# Patient Record
Sex: Female | Born: 1970 | Race: White | Hispanic: Yes | Marital: Married | Smoking: Never smoker
Health system: Southern US, Community
[De-identification: ages and names within clinical notes are randomized; demographics above are authoritative.]

## PROBLEM LIST (undated history)

## (undated) DIAGNOSIS — E079 Disorder of thyroid, unspecified: Secondary | ICD-10-CM

## (undated) DIAGNOSIS — T4145XA Adverse effect of unspecified anesthetic, initial encounter: Secondary | ICD-10-CM

## (undated) DIAGNOSIS — T8859XA Other complications of anesthesia, initial encounter: Secondary | ICD-10-CM

## (undated) HISTORY — DX: Disorder of thyroid, unspecified: E07.9

---

## 2005-01-01 ENCOUNTER — Emergency Department (HOSPITAL_COMMUNITY): Admission: EM | Admit: 2005-01-01 | Discharge: 2005-01-02 | Payer: Self-pay | Admitting: *Deleted

## 2005-11-19 ENCOUNTER — Encounter (INDEPENDENT_AMBULATORY_CARE_PROVIDER_SITE_OTHER): Payer: Self-pay | Admitting: *Deleted

## 2005-11-19 LAB — CONVERTED CEMR LAB

## 2005-12-01 ENCOUNTER — Ambulatory Visit: Payer: Self-pay | Admitting: Family Medicine

## 2005-12-08 ENCOUNTER — Ambulatory Visit: Payer: Self-pay | Admitting: Family Medicine

## 2006-01-08 ENCOUNTER — Ambulatory Visit: Payer: Self-pay | Admitting: Family Medicine

## 2006-01-20 ENCOUNTER — Ambulatory Visit (HOSPITAL_COMMUNITY): Admission: RE | Admit: 2006-01-20 | Discharge: 2006-01-20 | Payer: Self-pay | Admitting: Obstetrics & Gynecology

## 2006-02-09 ENCOUNTER — Ambulatory Visit: Payer: Self-pay | Admitting: Family Medicine

## 2006-03-10 ENCOUNTER — Ambulatory Visit: Payer: Self-pay | Admitting: Family Medicine

## 2006-03-15 ENCOUNTER — Encounter (INDEPENDENT_AMBULATORY_CARE_PROVIDER_SITE_OTHER): Payer: Self-pay | Admitting: Family Medicine

## 2006-03-15 ENCOUNTER — Ambulatory Visit: Payer: Self-pay | Admitting: Family Medicine

## 2006-03-19 ENCOUNTER — Encounter (INDEPENDENT_AMBULATORY_CARE_PROVIDER_SITE_OTHER): Payer: Self-pay | Admitting: *Deleted

## 2006-04-12 ENCOUNTER — Ambulatory Visit: Payer: Self-pay | Admitting: Sports Medicine

## 2006-04-13 ENCOUNTER — Ambulatory Visit: Payer: Self-pay | Admitting: Family Medicine

## 2006-04-13 ENCOUNTER — Encounter (INDEPENDENT_AMBULATORY_CARE_PROVIDER_SITE_OTHER): Payer: Self-pay | Admitting: Family Medicine

## 2006-05-17 ENCOUNTER — Ambulatory Visit: Payer: Self-pay | Admitting: Family Medicine

## 2006-05-28 ENCOUNTER — Ambulatory Visit: Payer: Self-pay | Admitting: Family Medicine

## 2006-05-30 ENCOUNTER — Inpatient Hospital Stay (HOSPITAL_COMMUNITY): Admission: AD | Admit: 2006-05-30 | Discharge: 2006-05-30 | Payer: Self-pay | Admitting: Family Medicine

## 2006-05-30 ENCOUNTER — Ambulatory Visit: Payer: Self-pay | Admitting: Obstetrics and Gynecology

## 2006-06-02 ENCOUNTER — Encounter (INDEPENDENT_AMBULATORY_CARE_PROVIDER_SITE_OTHER): Payer: Self-pay | Admitting: Family Medicine

## 2006-06-02 ENCOUNTER — Ambulatory Visit: Payer: Self-pay | Admitting: Family Medicine

## 2006-06-09 ENCOUNTER — Ambulatory Visit: Payer: Self-pay | Admitting: Family Medicine

## 2006-06-09 ENCOUNTER — Encounter: Payer: Self-pay | Admitting: Family Medicine

## 2006-06-09 LAB — CONVERTED CEMR LAB
Blood in Urine, dipstick: NEGATIVE
Ketones, urine, test strip: NEGATIVE
Nitrite: NEGATIVE
Urobilinogen, UA: 0.2
pH: 7

## 2006-06-16 ENCOUNTER — Ambulatory Visit: Payer: Self-pay | Admitting: Family Medicine

## 2006-06-21 ENCOUNTER — Ambulatory Visit: Payer: Self-pay | Admitting: Obstetrics & Gynecology

## 2006-06-21 ENCOUNTER — Inpatient Hospital Stay (HOSPITAL_COMMUNITY): Admission: AD | Admit: 2006-06-21 | Discharge: 2006-06-24 | Payer: Self-pay | Admitting: Obstetrics & Gynecology

## 2006-06-22 ENCOUNTER — Ambulatory Visit: Payer: Self-pay | Admitting: Family Medicine

## 2006-08-05 ENCOUNTER — Ambulatory Visit: Payer: Self-pay | Admitting: Family Medicine

## 2007-09-22 ENCOUNTER — Emergency Department (HOSPITAL_COMMUNITY): Admission: EM | Admit: 2007-09-22 | Discharge: 2007-09-23 | Payer: Self-pay | Admitting: Emergency Medicine

## 2009-08-04 ENCOUNTER — Emergency Department (HOSPITAL_COMMUNITY): Admission: EM | Admit: 2009-08-04 | Discharge: 2009-08-04 | Payer: Self-pay | Admitting: Emergency Medicine

## 2010-02-09 ENCOUNTER — Encounter: Payer: Self-pay | Admitting: Sports Medicine

## 2010-04-05 LAB — CBC
HCT: 40.9 % (ref 36.0–46.0)
Hemoglobin: 14.2 g/dL (ref 12.0–15.0)
MCHC: 34.8 g/dL (ref 30.0–36.0)
RBC: 4.48 MIL/uL (ref 3.87–5.11)
WBC: 6.4 10*3/uL (ref 4.0–10.5)

## 2010-04-05 LAB — BASIC METABOLIC PANEL
Calcium: 8.8 mg/dL (ref 8.4–10.5)
GFR calc Af Amer: >60 mL/min (ref >60)
GFR calc non Af Amer: >60 mL/min (ref >60)
Glucose, Bld: 85 mg/dL (ref 70–99)
Potassium: 3.3 mEq/L — ABNORMAL LOW (ref 3.5–5.1)
Sodium: 134 mEq/L — ABNORMAL LOW (ref 135–145)

## 2010-04-05 LAB — CK TOTAL AND CKMB (NOT AT ARMC)
CK, MB: 2.4 ng/mL (ref 0.3–4.0)
Relative Index: INVALID (ref 0.0–2.5)
Total CK: 98 U/L (ref 7–177)

## 2010-04-05 LAB — D-DIMER, QUANTITATIVE: D-Dimer, Quant: 0.26 ug/mL-FEU (ref 0.00–0.48)

## 2010-04-05 LAB — TROPONIN I: Troponin I: <0.01 ng/mL (ref 0.00–0.06)

## 2010-06-03 NOTE — Discharge Summary (Signed)
NAME:  Julia Espinoza, Julia Espinoza             ACCOUNT NO.:  [id number]   MEDICAL RECORD NO.:  [id number]          PATIENT TYPE:  INP   LOCATION:  9127                          FACILITY:  [facility]   PHYSICIAN:  Allie Bossier, MD        DATE OF BIRTH:  Dec 17, 1970   DATE OF ADMISSION:  06/21/2006  DATE OF DISCHARGE:                               DISCHARGE SUMMARY   DISCHARGE DIAGNOSES:  1. Status post primary low transverse Cesarean section for failure to      progress with delivery of viable female infant.  2. Postoperative anemia.  3. Status post bilateral tubal ligation.   DISCHARGE MEDICATIONS:  1. Percocet 5/325 one tablet every 4 hours as needed for pain.  2. Motrin 600 mg 1 tablet every 6 hours as needed for pain.  3. Colace 100 mg by mouth twice daily as needed for constipation.  4. Iron sulfate 325 mg by mouth once daily.   PERTINENT LABORATORY RESULTS:  Blood pressure type A positive. Rubella  immune. GBS negative. Hepatitis B status is negative. HIV is  nonreactive. Discharge hemoglobin is 8 with hematocrit of 24.7.   HOSPITAL COURSE:  This is a 40 year old, gravida 22, para 8-0-1-8 who  presented to [facility] with an intrauterine pregnancy at 39-4/7  weeks. She was in active labor. She was taken for primary cesarean  section due to failure to progress. Please see operative note for full  details of this procedure. The patient had a routine postpartum course  with the exception of some mild anemia. Her hemoglobin was 12.9 on  admission and subsequently 9.1, then 7.9, then 8. She did receive a 500  mL normal saline bolus on June 4 for some dizziness which has since  resolved at the time of discharge. She was ambulating without any  dizziness. She is breast and bottle feeding and does not desire  circumcision for her female infant. Her pain is well controlled with pain  medicines, and she is agreeable to early discharge on postoperative day  #2. She will follow up at the [facility] Department in 6  weeks for her postpartum check.     ______________________________  Sylvan Cheese, M.D.      Allie Bossier, MD  Electronically Signed    MJ/MEDQ  D:  06/24/2006  T:  06/24/2006  Job:  [id number]

## 2010-06-03 NOTE — Op Note (Signed)
NAME:  Julia Espinoza, Julia Espinoza             ACCOUNT NO.:  [id number]   MEDICAL RECORD NO.:  [id number]          PATIENT TYPE:  INP   LOCATION:  9127                          FACILITY:  [facility]   PHYSICIAN:  Allie Bossier, MD        DATE OF BIRTH:  [birth year]   DATE OF PROCEDURE:  06/22/2006  DATE OF DISCHARGE:                               OPERATIVE REPORT   PREOPERATIVE DIAGNOSES:  1. Cephalopelvic disproportion.  2. Grand multiparity, desires sterility.   POSTOPERATIVE DIAGNOSES:  1. Cephalopelvic disproportion.  2. Grand multiparity, desires sterility.   PROCEDURE:  Primary low transverse Cesarean section and postpartum  sterilization procedure with Filshie clips.   SURGEON:  Myra C. Marice Potter, M.D.   ANESTHESIA:  Epidural, Ninetta Lights, M.D.   COMPLICATIONS:  None.   ESTIMATED BLOOD LOSS:  800 mL.   SPECIMENS:  Cord blood.   FINDINGS:  1. Living female infant.  2. Normal pelvic anatomy.  3. Intact placenta with three-vessel cord.   DETAILS OF PROCEDURE AND FINDINGS:  Risks, benefits and alternatives of  surgery were explained, understood and accepted. She understands a 1%  failure rate of tubal ligation and wishes to proceed. Her epidural was  bolused for surgery in the operating room. She was placed in a dorsal  supine position with a left lateral tilt. Her abdomen was prepped and  draped in usual sterile fashion. Her legs were placed in the frog-leg  position to facilitate assistance elevating the head into the pelvis.  After adequate anesthesia was assured, a transverse incision was made  approximately 2 cm above the symphysis pubis. Incision was carried down  through the subcutaneous tissue to the fascia. Bleeding encountered was  cauterized with the Bovie. Fascia was scored in the midline. Fascial  incision was extended bilaterally. The rectus muscles were partially  separated in a transverse fashion using electrosurgical technique.  Excellent hemostasis was noted. Upon entering into  the pelvis, the  bladder was noted to be frankly distended. The Foley bulb was palpable  deep in the pelvis. Taking care to avoid the bladder, the peritoneum was  entered, and the peritoneal incision was extended bilaterally with the  Bovie. A bladder blade was placed, and a transverse incision was made on  the well-developed lower uterine segment. Amniotomy was performed with  hemostats. The baby was delivered from an occiput transverse position.  An assistant was able to push the head up from the vagina into the  pelvis with a marked amount of caput noted. The baby's mouth and  nostrils were suctioned after delivering the head. The remainder of the  baby was then delivered. His cord was clamped and cut, and he was  transferred to the pediatrician for routine care. Cord blood sample was  obtained. The placenta was extracted using traction. The uterus was left  in situ. The interior of the uterus was cleaned with a dry lap sponge, a  bladder blade was placed and the uterine incision was closed with one  layer of 0 chromic running locking suture. Excellent hemostasis was  noted. By tilting the uterus, I was able  to visualize each oviduct and  then the isthmic region of each oviduct. I placed a Filshie clip across  the entire oviduct. The uterine incision was again inspected and noted  to be hemostatic as were the rectus muscles and rectus fascia. Please  note that initially after the delivery of the baby's head the urine was  pink tinged. However, by the end of the case, clear urine was in the  Foley tubing. The fascia was then closed with a 0 Vicryl running  nonlocking suture. No defects were palpable. The subcutaneous tissue was  irrigated, cleaned and dried. It was then infiltrated with 20 mL of 0.5%  Marcaine. A subcuticular closure was done with a 3-0 Vicryl suture.  Steri-Strips were placed throughout the incision. She was taken to the  recovery room in stable condition with the  instrument, sponge and needle  counts correct.      Allie Bossier, MD  Electronically Signed     MCD/MEDQ  D:  06/22/2006  T:  06/22/2006  Job:  [id number]

## 2010-10-22 LAB — URINALYSIS, ROUTINE W REFLEX MICROSCOPIC
Bilirubin Urine: NEGATIVE
Glucose, UA: NEGATIVE
Ketones, ur: NEGATIVE
Protein, ur: NEGATIVE

## 2010-10-22 LAB — URINE MICROSCOPIC-ADD ON

## 2010-10-22 LAB — DIFFERENTIAL
Basophils Absolute: 0
Eosinophils Relative: 2
Lymphocytes Relative: 29
Lymphs Abs: 2.2
Monocytes Absolute: 0.5

## 2010-10-22 LAB — BASIC METABOLIC PANEL
Chloride: 104
GFR calc non Af Amer: >60
Glucose, Bld: 90
Potassium: 3.9
Sodium: 134 — ABNORMAL LOW

## 2010-10-22 LAB — WET PREP, GENITAL

## 2010-10-22 LAB — CBC
HCT: 39.8
Hemoglobin: 13
MCV: 81.6
RDW: 15.9 — ABNORMAL HIGH

## 2010-10-22 LAB — GC/CHLAMYDIA PROBE AMP, GENITAL: GC Probe Amp, Genital: NEGATIVE

## 2010-11-06 LAB — CBC
HCT: 23.6 — ABNORMAL LOW
Hemoglobin: 7.9 — CL
RBC: 3.34 — ABNORMAL LOW
RBC: 4.29
WBC: 12.9 — ABNORMAL HIGH
WBC: 9.1

## 2010-11-06 LAB — RPR: RPR Ser Ql: NONREACTIVE

## 2012-06-17 ENCOUNTER — Emergency Department (HOSPITAL_COMMUNITY): Payer: Medicaid Other

## 2012-06-17 ENCOUNTER — Encounter (HOSPITAL_COMMUNITY): Payer: Self-pay | Admitting: *Deleted

## 2012-06-17 ENCOUNTER — Encounter (HOSPITAL_COMMUNITY): Admission: EM | Disposition: A | Discharge status: Home / Self Care | Payer: Self-pay | Source: Home / Self Care

## 2012-06-17 ENCOUNTER — Observation Stay (HOSPITAL_COMMUNITY): Payer: Medicaid Other

## 2012-06-17 ENCOUNTER — Observation Stay (HOSPITAL_COMMUNITY)
Admission: EM | Admit: 2012-06-17 | Discharge: 2012-06-18 | Disposition: A | Discharge status: Home / Self Care | Payer: Medicaid Other | Attending: General Surgery | Admitting: General Surgery

## 2012-06-17 ENCOUNTER — Encounter (HOSPITAL_COMMUNITY): Payer: Self-pay | Admitting: Anesthesiology

## 2012-06-17 ENCOUNTER — Observation Stay (HOSPITAL_COMMUNITY): Payer: Medicaid Other | Admitting: Anesthesiology

## 2012-06-17 DIAGNOSIS — K819 Cholecystitis, unspecified: Secondary | ICD-10-CM

## 2012-06-17 DIAGNOSIS — R1013 Epigastric pain: Secondary | ICD-10-CM | POA: Insufficient documentation

## 2012-06-17 DIAGNOSIS — R7401 Elevation of levels of liver transaminase levels: Secondary | ICD-10-CM

## 2012-06-17 DIAGNOSIS — K8020 Calculus of gallbladder without cholecystitis without obstruction: Secondary | ICD-10-CM

## 2012-06-17 DIAGNOSIS — R112 Nausea with vomiting, unspecified: Secondary | ICD-10-CM

## 2012-06-17 DIAGNOSIS — K8000 Calculus of gallbladder with acute cholecystitis without obstruction: Principal | ICD-10-CM | POA: Insufficient documentation

## 2012-06-17 DIAGNOSIS — K8010 Calculus of gallbladder with chronic cholecystitis without obstruction: Secondary | ICD-10-CM | POA: Insufficient documentation

## 2012-06-17 DIAGNOSIS — R109 Unspecified abdominal pain: Secondary | ICD-10-CM

## 2012-06-17 DIAGNOSIS — R1011 Right upper quadrant pain: Secondary | ICD-10-CM | POA: Insufficient documentation

## 2012-06-17 HISTORY — PX: CHOLECYSTECTOMY: SHX55

## 2012-06-17 LAB — POCT I-STAT TROPONIN I: Troponin i, poc: 0 ng/mL (ref 0.00–0.08)

## 2012-06-17 LAB — CBC WITH DIFFERENTIAL/PLATELET
Eosinophils Relative: 2 % (ref 0–5)
HCT: 41.5 % (ref 36.0–46.0)
Lymphocytes Relative: 23 % (ref 12–46)
Lymphs Abs: 2.7 10*3/uL (ref 0.7–4.0)
MCV: 88.7 fL (ref 78.0–100.0)
Monocytes Absolute: 0.6 10*3/uL (ref 0.1–1.0)
Platelets: 259 10*3/uL (ref 150–400)
RBC: 4.68 MIL/uL (ref 3.87–5.11)
WBC: 11.6 10*3/uL — ABNORMAL HIGH (ref 4.0–10.5)

## 2012-06-17 LAB — COMPREHENSIVE METABOLIC PANEL
ALT: 146 U/L — ABNORMAL HIGH (ref 0–35)
CO2: 25 mEq/L (ref 19–32)
Calcium: 9.3 mg/dL (ref 8.4–10.5)
GFR calc Af Amer: >90 mL/min (ref >90)
GFR calc non Af Amer: >90 mL/min (ref >90)
Glucose, Bld: 149 mg/dL — ABNORMAL HIGH (ref 70–99)
Sodium: 134 mEq/L — ABNORMAL LOW (ref 135–145)
Total Bilirubin: 0.3 mg/dL (ref 0.3–1.2)

## 2012-06-17 LAB — URINALYSIS, ROUTINE W REFLEX MICROSCOPIC
Bilirubin Urine: NEGATIVE
Hgb urine dipstick: NEGATIVE
Ketones, ur: 15 mg/dL — AB
Protein, ur: NEGATIVE mg/dL
Urobilinogen, UA: 0.2 mg/dL (ref 0.0–1.0)

## 2012-06-17 LAB — SURGICAL PCR SCREEN: MRSA, PCR: NEGATIVE

## 2012-06-17 SURGERY — LAPAROSCOPIC CHOLECYSTECTOMY WITH INTRAOPERATIVE CHOLANGIOGRAM
Anesthesia: General | Site: Abdomen | Wound class: Clean Contaminated

## 2012-06-17 MED ORDER — MORPHINE SULFATE 4 MG/ML IJ SOLN
4.0000 mg | Freq: Once | INTRAMUSCULAR | Status: AC
  Administered 2012-06-17: 4 mg via INTRAVENOUS
  Filled 2012-06-17: qty 1

## 2012-06-17 MED ORDER — HYDROMORPHONE HCL PF 1 MG/ML IJ SOLN
INTRAMUSCULAR | Status: AC
  Filled 2012-06-17: qty 1

## 2012-06-17 MED ORDER — MIDAZOLAM HCL 5 MG/5ML IJ SOLN
INTRAMUSCULAR | Status: DC | PRN
  Administered 2012-06-17 (×2): 1 mg via INTRAVENOUS

## 2012-06-17 MED ORDER — LIDOCAINE HCL (CARDIAC) 20 MG/ML IV SOLN
INTRAVENOUS | Status: DC | PRN
  Administered 2012-06-17: 100 mg via INTRAVENOUS

## 2012-06-17 MED ORDER — BUPIVACAINE HCL 0.25 % IJ SOLN
INTRAMUSCULAR | Status: DC | PRN
  Administered 2012-06-17: 7.5 mL

## 2012-06-17 MED ORDER — LIDOCAINE HCL 4 % MT SOLN
OROMUCOSAL | Status: DC | PRN
  Administered 2012-06-17: 2 mL via TOPICAL

## 2012-06-17 MED ORDER — HYDROMORPHONE HCL PF 1 MG/ML IJ SOLN: 1.0000 mg | INTRAMUSCULAR | Status: DC | PRN

## 2012-06-17 MED ORDER — IOHEXOL 300 MG/ML  SOLN
INTRAMUSCULAR | Status: DC | PRN
  Administered 2012-06-17: 5 mL via INTRAVENOUS

## 2012-06-17 MED ORDER — OXYCODONE HCL 5 MG PO TABS: 5.0000 mg | ORAL_TABLET | Freq: Once | ORAL | Status: DC | PRN

## 2012-06-17 MED ORDER — DIPHENHYDRAMINE HCL 50 MG/ML IJ SOLN: 12.5000 mg | Freq: Four times a day (QID) | INTRAMUSCULAR | Status: DC | PRN

## 2012-06-17 MED ORDER — MORPHINE SULFATE 2 MG/ML IJ SOLN
INTRAMUSCULAR | Status: AC
  Filled 2012-06-17: qty 1

## 2012-06-17 MED ORDER — FENTANYL CITRATE 0.05 MG/ML IJ SOLN
INTRAMUSCULAR | Status: DC | PRN
  Administered 2012-06-17 (×2): 50 ug via INTRAVENOUS
  Administered 2012-06-17: 125 ug via INTRAVENOUS
  Administered 2012-06-17: 100 ug via INTRAVENOUS
  Administered 2012-06-17: 25 ug via INTRAVENOUS

## 2012-06-17 MED ORDER — OXYCODONE-ACETAMINOPHEN 5-325 MG PO TABS
1.0000 | ORAL_TABLET | ORAL | Status: DC | PRN
  Administered 2012-06-17: 2 via ORAL
  Administered 2012-06-17 – 2012-06-18 (×2): 1 via ORAL
  Filled 2012-06-17 (×2): qty 1
  Filled 2012-06-17: qty 2

## 2012-06-17 MED ORDER — ONDANSETRON HCL 4 MG/2ML IJ SOLN
INTRAMUSCULAR | Status: DC | PRN
  Administered 2012-06-17: 4 mg via INTRAVENOUS

## 2012-06-17 MED ORDER — KCL IN DEXTROSE-NACL 20-5-0.45 MEQ/L-%-% IV SOLN
INTRAVENOUS | Status: DC
  Administered 2012-06-17: 18:00:00 via INTRAVENOUS
  Filled 2012-06-17 (×3): qty 1000

## 2012-06-17 MED ORDER — PROPOFOL 10 MG/ML IV BOLUS
INTRAVENOUS | Status: DC | PRN
  Administered 2012-06-17: 200 mg via INTRAVENOUS

## 2012-06-17 MED ORDER — HYDROMORPHONE HCL PF 1 MG/ML IJ SOLN
0.2500 mg | INTRAMUSCULAR | Status: DC | PRN
  Administered 2012-06-17: 0.5 mg via INTRAVENOUS

## 2012-06-17 MED ORDER — ROCURONIUM BROMIDE 100 MG/10ML IV SOLN
INTRAVENOUS | Status: DC | PRN
  Administered 2012-06-17: 40 mg via INTRAVENOUS

## 2012-06-17 MED ORDER — CEFAZOLIN SODIUM-DEXTROSE 2-3 GM-% IV SOLR
INTRAVENOUS | Status: DC | PRN
  Administered 2012-06-17: 2 g via INTRAVENOUS

## 2012-06-17 MED ORDER — PANTOPRAZOLE SODIUM 40 MG IV SOLR
40.0000 mg | Freq: Every day | INTRAVENOUS | Status: DC
  Administered 2012-06-17: 40 mg via INTRAVENOUS
  Filled 2012-06-17 (×2): qty 40

## 2012-06-17 MED ORDER — NEOSTIGMINE METHYLSULFATE 1 MG/ML IJ SOLN
INTRAMUSCULAR | Status: DC | PRN
  Administered 2012-06-17: 3 mg via INTRAVENOUS

## 2012-06-17 MED ORDER — LACTATED RINGERS IV SOLN
INTRAVENOUS | Status: DC | PRN
  Administered 2012-06-17: 12:00:00 via INTRAVENOUS

## 2012-06-17 MED ORDER — CEFAZOLIN SODIUM 1-5 GM-% IV SOLN
INTRAVENOUS | Status: AC
  Filled 2012-06-17: qty 100

## 2012-06-17 MED ORDER — GLYCOPYRROLATE 0.2 MG/ML IJ SOLN
INTRAMUSCULAR | Status: DC | PRN
  Administered 2012-06-17: 0.4 mg via INTRAVENOUS

## 2012-06-17 MED ORDER — CIPROFLOXACIN IN D5W 400 MG/200ML IV SOLN
400.0000 mg | Freq: Two times a day (BID) | INTRAVENOUS | Status: DC
  Filled 2012-06-17 (×2): qty 200

## 2012-06-17 MED ORDER — SODIUM CHLORIDE 0.9 % IR SOLN
Status: DC | PRN
  Administered 2012-06-17 (×2): 1000 mL

## 2012-06-17 MED ORDER — ONDANSETRON HCL 4 MG/2ML IJ SOLN
4.0000 mg | Freq: Four times a day (QID) | INTRAMUSCULAR | Status: DC | PRN
  Administered 2012-06-17: 4 mg via INTRAVENOUS
  Filled 2012-06-17: qty 2

## 2012-06-17 MED ORDER — FAMOTIDINE IN NACL 20-0.9 MG/50ML-% IV SOLN
20.0000 mg | Freq: Once | INTRAVENOUS | Status: AC
  Administered 2012-06-17: 20 mg via INTRAVENOUS
  Filled 2012-06-17: qty 50

## 2012-06-17 MED ORDER — SODIUM CHLORIDE 0.9 % IV BOLUS (SEPSIS)
1000.0000 mL | Freq: Once | INTRAVENOUS | Status: AC
  Administered 2012-06-17: 1000 mL via INTRAVENOUS

## 2012-06-17 MED ORDER — ONDANSETRON HCL 4 MG PO TABS: 4.0000 mg | ORAL_TABLET | Freq: Four times a day (QID) | ORAL | Status: DC | PRN

## 2012-06-17 MED ORDER — OXYCODONE HCL 5 MG/5ML PO SOLN: 5.0000 mg | Freq: Once | ORAL | Status: DC | PRN

## 2012-06-17 MED ORDER — KCL IN DEXTROSE-NACL 20-5-0.45 MEQ/L-%-% IV SOLN
INTRAVENOUS | Status: DC
  Filled 2012-06-17 (×3): qty 1000

## 2012-06-17 MED ORDER — MORPHINE SULFATE 2 MG/ML IJ SOLN
2.0000 mg | INTRAMUSCULAR | Status: DC | PRN
  Administered 2012-06-17: 2 mg via INTRAVENOUS
  Administered 2012-06-17: 4 mg via INTRAVENOUS
  Filled 2012-06-17: qty 2

## 2012-06-17 MED ORDER — DIPHENHYDRAMINE HCL 12.5 MG/5ML PO ELIX: 12.5000 mg | ORAL_SOLUTION | Freq: Four times a day (QID) | ORAL | Status: DC | PRN

## 2012-06-17 MED ORDER — PROMETHAZINE HCL 25 MG/ML IJ SOLN: 6.2500 mg | INTRAMUSCULAR | Status: DC | PRN

## 2012-06-17 MED ORDER — ONDANSETRON 4 MG PO TBDP
8.0000 mg | ORAL_TABLET | Freq: Once | ORAL | Status: AC
  Administered 2012-06-17: 8 mg via ORAL
  Filled 2012-06-17: qty 2

## 2012-06-17 MED ORDER — LACTATED RINGERS IV SOLN: INTRAVENOUS | Status: DC

## 2012-06-17 MED ORDER — ONDANSETRON HCL 4 MG/2ML IJ SOLN: 4.0000 mg | Freq: Four times a day (QID) | INTRAMUSCULAR | Status: DC | PRN

## 2012-06-17 SURGICAL SUPPLY — 39 items
APPLIER CLIP 5 13 M/L LIGAMAX5 (MISCELLANEOUS) ×2
BANDAGE ADHESIVE 1X3 (GAUZE/BANDAGES/DRESSINGS) ×2 IMPLANT
BENZOIN TINCTURE PRP APPL 2/3 (GAUZE/BANDAGES/DRESSINGS) ×2 IMPLANT
CANISTER SUCTION 2500CC (MISCELLANEOUS) ×2 IMPLANT
CHLORAPREP W/TINT 26ML (MISCELLANEOUS) ×2 IMPLANT
CLIP APPLIE 5 13 M/L LIGAMAX5 (MISCELLANEOUS) ×1 IMPLANT
CLOTH BEACON ORANGE TIMEOUT ST (SAFETY) ×2 IMPLANT
CLSR STERI-STRIP ANTIMIC 1/2X4 (GAUZE/BANDAGES/DRESSINGS) ×2 IMPLANT
COVER MAYO STAND STRL (DRAPES) ×2 IMPLANT
COVER SURGICAL LIGHT HANDLE (MISCELLANEOUS) ×2 IMPLANT
DEVICE TROCAR PUNCTURE CLOSURE (ENDOMECHANICALS) ×2 IMPLANT
DRAPE C-ARM 42X72 X-RAY (DRAPES) ×2 IMPLANT
DRAPE UTILITY 15X26 W/TAPE STR (DRAPE) ×4 IMPLANT
ELECT REM PT RETURN 9FT ADLT (ELECTROSURGICAL) ×2
ELECTRODE REM PT RTRN 9FT ADLT (ELECTROSURGICAL) ×1 IMPLANT
GAUZE SPONGE 2X2 8PLY STRL LF (GAUZE/BANDAGES/DRESSINGS) ×1 IMPLANT
GLOVE BIO SURGEON STRL SZ7.5 (GLOVE) ×4 IMPLANT
GOWN STRL NON-REIN LRG LVL3 (GOWN DISPOSABLE) ×6 IMPLANT
GOWN STRL REIN XL XLG (GOWN DISPOSABLE) ×4 IMPLANT
IV CATH 14GX2 1/4 (CATHETERS) ×2 IMPLANT
KIT BASIN OR (CUSTOM PROCEDURE TRAY) ×2 IMPLANT
KIT ROOM TURNOVER OR (KITS) ×2 IMPLANT
NEEDLE INSUFFLATION 14GA 120MM (NEEDLE) ×2 IMPLANT
NS IRRIG 1000ML POUR BTL (IV SOLUTION) ×2 IMPLANT
PAD ARMBOARD 7.5X6 YLW CONV (MISCELLANEOUS) ×4 IMPLANT
POUCH SPECIMEN RETRIEVAL 10MM (ENDOMECHANICALS) ×2 IMPLANT
SCISSORS LAP 5X35 DISP (ENDOMECHANICALS) ×2 IMPLANT
SET CHOLANGIOGRAPHY FRANKLIN (SET/KITS/TRAYS/PACK) ×2 IMPLANT
SET IRRIG TUBING LAPAROSCOPIC (IRRIGATION / IRRIGATOR) ×2 IMPLANT
SLEEVE ENDOPATH XCEL 5M (ENDOMECHANICALS) ×2 IMPLANT
SPECIMEN JAR SMALL (MISCELLANEOUS) ×2 IMPLANT
SPONGE GAUZE 2X2 STER 10/PKG (GAUZE/BANDAGES/DRESSINGS) ×1
SUT MNCRL AB 3-0 PS2 18 (SUTURE) ×2 IMPLANT
SUT MNCRL AB 4-0 PS2 18 (SUTURE) ×4 IMPLANT
TOWEL OR 17X24 6PK STRL BLUE (TOWEL DISPOSABLE) ×2 IMPLANT
TOWEL OR 17X26 10 PK STRL BLUE (TOWEL DISPOSABLE) ×2 IMPLANT
TRAY LAPAROSCOPIC (CUSTOM PROCEDURE TRAY) ×2 IMPLANT
TROCAR XCEL NON-BLD 11X100MML (ENDOMECHANICALS) ×2 IMPLANT
TROCAR XCEL NON-BLD 5MMX100MML (ENDOMECHANICALS) ×2 IMPLANT

## 2012-06-17 NOTE — Anesthesia Postprocedure Evaluation (Signed)
Anesthesia Post Note  Patient: Julia Espinoza  Procedure(s) Performed: Procedure(s) (LRB): LAPAROSCOPIC CHOLECYSTECTOMY WITH INTRAOPERATIVE CHOLANGIOGRAM (N/A)  Anesthesia type: general  Patient location: PACU  Post pain: Pain level controlled  Post assessment: Patient's Cardiovascular Status Stable  Last Vitals:  Filed Vitals:   06/17/12 1415  BP: 109/57  Pulse: 66  Temp:   Resp: 15    Post vital signs: Reviewed and stable  Level of consciousness: sedated  Complications: No apparent anesthesia complications

## 2012-06-17 NOTE — Op Note (Signed)
Pre Operative Diagnosis: acute cholecystitis  Post Operative Diagnosis: same  Surgeon: Dr. Axel Filler   Procedure: laparoscopic cholecystectomy with IOC  Assistant: none  Anesthesia: Gen. Endotracheal anesthesia   EBL: 10 cc  Complications:  Counts: reported as correct x 2   Findings: The patient had normal intraoperative cholangiogram. Chronic cholecystitis.  Indications for procedure: the patient is a 42 year old female with a history of several days of abdominal pain. Patient was diagnosed the ED with cholecystitis. Patient was counseled and consented to have her gallbladder removed.  Details of the procedure:  The patient was taken to the operating and placed in the supine position with bilateral SCDs in place. A time out was called and all facts were verified. A pneumoperitoneum was obtained via A Veress needle technique to a pressure of 14mm of mercury. A 5mm trochar was then placed in the right upper quadrant under visualization, and there were no injuries to any abdominal organs. A 11 mm port was then placed in the umbilical region after infiltrating with local anesthesia under direct visualization. A second and third epigastric port and right lower quadrant port placement under direct visualization, respectively. The gallbladder was identified and retracted, the peritoneum was then sharply dissected from the gallbladder and this dissection was carried down to Calot's triangle. The gallbladder was identified and stripped away circumferentially and seen going into the gallbladder 360. A Cook catheter was used to perform an intraoperative cholangiogram. The biliary radicals as well as the cystic duct and common bile duct were seen free of filling defects.  2 clips were placed proximally one distally and the cystic duct transected. The cystic artery was identified and 2 clips placed proximally and one distally and transected.  We then proceeded to remove the gallbladder off the  hepatic fossa with Bovie cautery. An Endo Catch bag was then placed in the abdomen and gallbladder placed in the bag. The hepatic fossa was then reexamined and hemostasis was achieved with Bovie cautery and was excellent at the end of the case. The subhepatic fossa and perihepatic fossa was then irrigated until the effluent was clear. The 11 mm trocar fascia was reapproximated with the Endo Close #1 Vicryl.  The pneumoperitoneum was evacuated and all trochars removed under direct visulalization.  The skin was then closed with 4-0 Monocryl and the skin dressed with Steri-Strips, gauze, and tape.  The patient was awaken from general anesthesia and taken to the recovery room in stable condition.

## 2012-06-17 NOTE — ED Notes (Signed)
Pt returned from US, placed back on monitor.

## 2012-06-17 NOTE — ED Provider Notes (Signed)
History     CSN: [id number]  Arrival date & time 06/17/12  0135   First MD Initiated Contact with Patient 06/17/12 0246      Chief Complaint  Patient presents with  . Abdominal Pain    (Consider location/radiation/quality/duration/timing/severity/associated sxs/prior treatment) HPI  Patient is a generally healthy 42 yo woman who is BIB family for evaluation and tx of abdominal pain, nausea and vomiting. Sx began three days ago but have been worsening. Pain is constant, aching, cramping, severe.    Patient denies history of similar sx. No fever. No diarrhea, bloody stools or GU sx.   History reviewed. No pertinent past medical history.  History reviewed. No pertinent past surgical history.  No family history on file.  History  Substance Use Topics  . Smoking status: Never Smoker   . Smokeless tobacco: Not on file  . Alcohol Use: No    OB History   Grav Para Term Preterm Abortions TAB SAB Ect Mult Living                  Review of Systems Gen: no weight loss, fevers, chills, night sweats Eyes: no discharge or drainage, no occular pain or visual changes Nose: no epistaxis or rhinorrhea Mouth: no dental pain, no sore throat Neck: no neck pain Lungs: no SOB, cough, wheezing CV: no chest pain, palpitations, dependent edema or orthopnea Abd: As per history of present illness, otherwise negative GU: no dysuria or gross hematuria MSK: no myalgias or arthralgias Neuro: no headache, no focal neurologic deficits Skin: no rash Psyche: negative.  Allergies  Review of patient's allergies indicates no known allergies.  Home Medications  No current outpatient prescriptions on file.  BP 130/72  Pulse 60  Temp(Src) 98.5 F (36.9 C) (Oral)  SpO2 99%  LMP 05/08/2012  Physical Exam Gen: well developed and well nourished appearing, appears to be in significant distress, writhing back and forth in bed and crying Head: NCAT Eyes: PERL, EOMI sclera are nonicteric Nose:  no epistaixis or rhinorrhea Mouth/throat: mucosa is moist and pink Neck: supple, no stridor Lungs: CTA B, no wheezing, rhonchi or rales CV: RRR, no murmur Abd: soft, nondistended there is significant tenderness over the epigastrium-most notably so over the midline epigastrium, no rebound or guarding. Back: no ttp, no cva ttp Skin: no rashese, wnl Neuro: CN ii-xii grossly intact, no focal deficits Psyche; normal affect,  anxious and uncomfortable appearing, and cooperative.  ED Course  Procedures (including critical care time)  Results for orders placed during the hospital encounter of 06/17/12 (from the past 24 hour(s))  CBC WITH DIFFERENTIAL     Status: Abnormal   Collection Time    06/17/12  2:30 AM      Result Value Range   WBC 11.6 (*) 4.0 - 10.5 K/uL   RBC 4.68  3.87 - 5.11 MIL/uL   Hemoglobin 14.1  12.0 - 15.0 g/dL   HCT 29.5  28.4 - 13.2 %   MCV 88.7  78.0 - 100.0 fL   MCH 30.1  26.0 - 34.0 pg   MCHC 34.0  30.0 - 36.0 g/dL   RDW 44.0  10.2 - 72.5 %   Platelets 259  150 - 400 K/uL   Neutrophils Relative % 69  43 - 77 %   Neutro Abs 7.9 (*) 1.7 - 7.7 K/uL   Lymphocytes Relative 23  12 - 46 %   Lymphs Abs 2.7  0.7 - 4.0 K/uL   Monocytes Relative 6  3 - 12 %   Monocytes Absolute 0.6  0.1 - 1.0 K/uL   Eosinophils Relative 2  0 - 5 %   Eosinophils Absolute 0.3  0.0 - 0.7 K/uL   Basophils Relative 0  0 - 1 %   Basophils Absolute 0.0  0.0 - 0.1 K/uL  COMPREHENSIVE METABOLIC PANEL     Status: Abnormal   Collection Time    06/17/12  2:30 AM      Result Value Range   Sodium 134 (*) 135 - 145 mEq/L   Potassium 4.1  3.5 - 5.1 mEq/L   Chloride 96  96 - 112 mEq/L   CO2 25  19 - 32 mEq/L   Glucose, Bld 149 (*) 70 - 99 mg/dL   BUN 18  6 - 23 mg/dL   Creatinine, Ser 4.54  0.50 - 1.10 mg/dL   Calcium 9.3  8.4 - 09.8 mg/dL   Total Protein 8.5 (*) 6.0 - 8.3 g/dL   Albumin 4.2  3.5 - 5.2 g/dL   AST 119 (*) 0 - 37 U/L   ALT 146 (*) 0 - 35 U/L   Alkaline Phosphatase 107  39 - 117  U/L   Total Bilirubin 0.3  0.3 - 1.2 mg/dL   GFR calc non Af Amer >90  >90 mL/min   GFR calc Af Amer >90  >90 mL/min  LIPASE, BLOOD     Status: None   Collection Time    06/17/12  2:30 AM      Result Value Range   Lipase 57  11 - 59 U/L  POCT I-STAT TROPONIN I     Status: None   Collection Time    06/17/12  2:41 AM      Result Value Range   Troponin i, poc 0.00  0.00 - 0.08 ng/mL   Comment 3            Gallbladder: Stones and sludge are noted near the gallbladder  fundus; the patient could not be repositioned to assess for  mobility of the stones. No gallbladder wall thickening or  pericholecystic fluid is seen. Evaluation for Murphy's sign is not  possible as the patient is relatively sedated.  Common Bile Duct: 0.7 cm proximally, and 0.5 cm at the head of the  pancreas; mildly prominent, without definite evidence for distal  obstruction.  Liver: Normal parenchymal echogenicity and echotexture; no focal  lesions identified. Limited Doppler evaluation demonstrates normal  blood flow within the liver.  IVC: Unremarkable in appearance.  Pancreas: Although the pancreas is difficult to visuali9ze in its  entirety due to overlying bowel gas, no focal pancreatic  abnormality is identified.  Spleen: 9.2 cm in length; within normal limits in size and  echotexture.  Right kidney: 10.5 cm in length; normal in size, configuration and  parenchymal echogenicity. No evidence of mass or hydronephrosis.  Left kidney: 10.9 cm in length; normal in size, configuration and  parenchymal echogenicity. No evidence of mass or hydronephrosis.  Abdominal Aorta: Normal in caliber; no aneurysm identified.  IMPRESSION:  Stones and sludge noted within the gallbladder; no evidence for  obstruction or cholecystitis, though the patient is relatively  sedated and evaluation for Murphy's sign was not possible.  The gallbladder is otherwise grossly unremarkable; mild prominence  of the proximal common hepatic  duct, without definite evidence of  distal obstruction.    MDM  DDX: gastritis, PUD, GERD, pancreatitis, gallbladder disease, SBO, colitis, UTI, enteritis.   Sx and findings are  most suggestive of early cholecystitis. The patient is feeling somewhat better but, continues to have pain after treatment with IV fluids, Pepcid, Zofran and morphine. I have paged general surgery to request consultation to admit the patient and perform urgent cholecystectomy.  1610:  Case discussed with Dr. Georg Ruddle who will see and evaulate fo admission.         Brandt Loosen, MD 06/17/12 [phone]

## 2012-06-17 NOTE — ED Notes (Signed)
MD Thompson at bedside.  

## 2012-06-17 NOTE — Preoperative (Signed)
Beta Blockers   Reason not to administer Beta Blockers:Not Applicable 

## 2012-06-17 NOTE — Transfer of Care (Signed)
Immediate Anesthesia Transfer of Care Note  Patient: Julia Espinoza  Procedure(s) Performed: Procedure(s): LAPAROSCOPIC CHOLECYSTECTOMY WITH INTRAOPERATIVE CHOLANGIOGRAM (N/A)  Patient Location: PACU  Anesthesia Type:General  Level of Consciousness: sedated  Airway & Oxygen Therapy: Patient Spontanous Breathing and Patient connected to face mask oxygen  Post-op Assessment: Report given to PACU RN, Post -op Vital signs reviewed and stable and Patient moving all extremities  Post vital signs: Reviewed and stable  Complications: No apparent anesthesia complications

## 2012-06-17 NOTE — Anesthesia Procedure Notes (Signed)
Procedure Name: Intubation Date/Time: 06/17/2012 12:00 PM Performed by: Orvilla Fus A Pre-anesthesia Checklist: Timeout performed, Patient identified, Emergency Drugs available, Suction available and Patient being monitored Patient Re-evaluated:Patient Re-evaluated prior to inductionOxygen Delivery Method: Circle system utilized Preoxygenation: Pre-oxygenation with 100% oxygen Intubation Type: IV induction Ventilation: Mask ventilation without difficulty Laryngoscope Size: Miller and 2 Grade View: Grade II Tube type: Oral Tube size: 7.0 mm Number of attempts: 1 Airway Equipment and Method: Stylet and LTA kit utilized Placement Confirmation: ETT inserted through vocal cords under direct vision,  breath sounds checked- equal and bilateral and positive ETCO2 Secured at: 21 cm Tube secured with: Tape Dental Injury: Teeth and Oropharynx as per pre-operative assessment

## 2012-06-17 NOTE — ED Notes (Signed)
Pt transported to US

## 2012-06-17 NOTE — ED Notes (Signed)
EKG given to Dr. Manly. Copy placed in pt chart. 

## 2012-06-17 NOTE — ED Notes (Addendum)
Generalized epigastric abdominal pain. Hurts more at night. Nauseated. No fevers, chills. Pt. Pale and lethargic. Denies any bleeding when vomiting and bowel movement.

## 2012-06-17 NOTE — H&P (Signed)
Julia Espinoza is an 42 y.o. female.   Chief Complaint: Right upper quadrant abdominal pain HPI: 42 year old Spanish-speaking female complains of a three-day history of right upper quadrant abdominal pain associated with nausea and vomiting. She denies previous similar episodes. She came for further evaluation at [facility] emergency department. Workup here demonstrates mild leukocytosis, mild elevation of transaminases, and abdominal ultrasound shows gallstones and sludge. She continues to have pain in her right upper quadrant. She is also nauseated and has vomited several times and she is unable to keep anything down by mouth in the emergency department.  History reviewed. No pertinent past medical history.  Past surgical history: Cesarean section  No family history on file. Social History:  reports that she has never smoked. She does not have any smokeless tobacco history on file. She reports that she does not drink alcohol or use illicit drugs.  Allergies: No Known Allergies   (Not in a hospital admission)  Results for orders placed during the hospital encounter of 06/17/12 (from the past 48 hour(s))  CBC WITH DIFFERENTIAL     Status: Abnormal   Collection Time    06/17/12  2:30 AM      Result Value Range   WBC 11.6 (*) 4.0 - 10.5 K/uL   RBC 4.68  3.87 - 5.11 MIL/uL   Hemoglobin 14.1  12.0 - 15.0 g/dL   HCT 16.1  09.6 - 04.5 %   MCV 88.7  78.0 - 100.0 fL   MCH 30.1  26.0 - 34.0 pg   MCHC 34.0  30.0 - 36.0 g/dL   RDW 40.9  81.1 - 91.4 %   Platelets 259  150 - 400 K/uL   Neutrophils Relative % 69  43 - 77 %   Neutro Abs 7.9 (*) 1.7 - 7.7 K/uL   Lymphocytes Relative 23  12 - 46 %   Lymphs Abs 2.7  0.7 - 4.0 K/uL   Monocytes Relative 6  3 - 12 %   Monocytes Absolute 0.6  0.1 - 1.0 K/uL   Eosinophils Relative 2  0 - 5 %   Eosinophils Absolute 0.3  0.0 - 0.7 K/uL   Basophils Relative 0  0 - 1 %   Basophils Absolute 0.0  0.0 - 0.1 K/uL  COMPREHENSIVE METABOLIC PANEL     Status:  Abnormal   Collection Time    06/17/12  2:30 AM      Result Value Range   Sodium 134 (*) 135 - 145 mEq/L   Potassium 4.1  3.5 - 5.1 mEq/L   Comment: HEMOLYSIS AT THIS LEVEL MAY AFFECT RESULT   Chloride 96  96 - 112 mEq/L   CO2 25  19 - 32 mEq/L   Glucose, Bld 149 (*) 70 - 99 mg/dL   BUN 18  6 - 23 mg/dL   Creatinine, Ser 7.82  0.50 - 1.10 mg/dL   Calcium 9.3  8.4 - 95.6 mg/dL   Total Protein 8.5 (*) 6.0 - 8.3 g/dL   Albumin 4.2  3.5 - 5.2 g/dL   AST 213 (*) 0 - 37 U/L   ALT 146 (*) 0 - 35 U/L   Alkaline Phosphatase 107  39 - 117 U/L   Total Bilirubin 0.3  0.3 - 1.2 mg/dL   GFR calc non Af Amer >90  >90 mL/min   GFR calc Af Amer >90  >90 mL/min   Comment:            The eGFR has been  calculated     using the CKD EPI equation.     This calculation has not been     validated in all clinical     situations.     eGFR's persistently     <90 mL/min signify     possible Chronic Kidney Disease.  LIPASE, BLOOD     Status: None   Collection Time    06/17/12  2:30 AM      Result Value Range   Lipase 57  11 - 59 U/L  POCT I-STAT TROPONIN I     Status: None   Collection Time    06/17/12  2:41 AM      Result Value Range   Troponin i, poc 0.00  0.00 - 0.08 ng/mL   Comment 3            Comment: Due to the release kinetics of cTnI,     a negative result within the first hours     of the onset of symptoms does not rule out     myocardial infarction with certainty.     If myocardial infarction is still suspected,     repeat the test at appropriate intervals.   US Abdomen Complete  06/17/2012   *RADIOLOGY REPORT*  Clinical Data:  Epigastric and right upper quadrant abdominal pain. Nausea and vomiting.  ABDOMINAL ULTRASOUND COMPLETE  Comparison:  None  Findings:  Gallbladder:  Stones and sludge are noted near the gallbladder fundus; the patient could not be repositioned to assess for mobility of the stones.  No gallbladder wall thickening or pericholecystic fluid is seen.  Evaluation for  Murphy's sign is not possible as the patient is relatively sedated.  Common Bile Duct:  0.7 cm proximally, and 0.5 cm at the head of the pancreas; mildly prominent, without definite evidence for distal obstruction.  Liver:  Normal parenchymal echogenicity and echotexture; no focal lesions identified.  Limited Doppler evaluation demonstrates normal blood flow within the liver.  IVC:  Unremarkable in appearance.  Pancreas:  Although the pancreas is difficult to visuali9ze in its entirety due to overlying bowel gas, no focal pancreatic abnormality is identified.  Spleen:  9.2 cm in length; within normal limits in size and echotexture.  Right kidney:  10.5 cm in length; normal in size, configuration and parenchymal echogenicity.  No evidence of mass or hydronephrosis.  Left kidney:  10.9 cm in length; normal in size, configuration and parenchymal echogenicity.  No evidence of mass or hydronephrosis.  Abdominal Aorta:  Normal in caliber; no aneurysm identified.  IMPRESSION: Stones and sludge noted within the gallbladder; no evidence for obstruction or cholecystitis, though the patient is relatively sedated and evaluation for Murphy's sign was not possible.  The gallbladder is otherwise grossly unremarkable; mild prominence of the proximal common hepatic duct, without definite evidence of distal obstruction.   Original Report Authenticated By: Tonia Ghent, M.D.    Review of Systems  Constitutional: Positive for malaise/fatigue.  HENT: Negative.   Eyes: Negative.   Respiratory: Negative.   Cardiovascular: Negative.   Gastrointestinal: Positive for nausea, vomiting and abdominal pain. Negative for constipation.  Genitourinary: Negative.   Musculoskeletal: Negative.   Skin: Negative.   Neurological: Negative.   Endo/Heme/Allergies: Negative.     Blood pressure 107/64, pulse 58, temperature 98.5 F (36.9 C), temperature source Oral, resp. rate 23, last menstrual period 05/08/2012, SpO2 97.00%. Physical  Exam  Constitutional: She is oriented to person, place, and time. She appears well-developed and well-nourished. She appears  distressed.  Mild distress due to very recent vomiting  HENT:  Head: Normocephalic and atraumatic.  Nose: Nose normal.  Mouth/Throat: Oropharynx is clear and moist. No oropharyngeal exudate.  Eyes: Conjunctivae and EOM are normal. Pupils are equal, round, and reactive to light. No scleral icterus.  Neck: Normal range of motion. Neck supple. No tracheal deviation present.  Cardiovascular: Normal rate, normal heart sounds and intact distal pulses.   No murmur heard. Heart rate 55  Respiratory: Effort normal and breath sounds normal. No stridor. No respiratory distress. She has no wheezes. She has no rales. She exhibits no tenderness.  GI: Soft. She exhibits no distension. There is tenderness. There is no rebound and no guarding.  Tenderness in the right upper quadrant without guarding, no generalized tenderness, no peritoneal signs  Musculoskeletal: Normal range of motion.  Neurological: She is alert and oriented to person, place, and time. She exhibits normal muscle tone.  Skin: Skin is warm and dry.     Assessment/Plan Symptomatic cholelithiasis, transaminitis, intractable nausea: will admit to the hospital, hydrated with IV fluids, start IV antibiotics, and plan laparoscopic cholecystectomy this admission. Procedure was discussed in detail with the patient in Spanish by myself. She is also mildly bradycardic during my examination. Seems asymptomatic. It may be due to a vagal response from recent vomiting. We'll check an EKG. Plan was discussed in detail with the patient and her family member.  Debara Kamphuis E 06/17/2012, 4:44 AM

## 2012-06-17 NOTE — Anesthesia Preprocedure Evaluation (Signed)
Anesthesia Evaluation  Patient identified by MRN, date of birth, ID band Patient awake    Reviewed: Allergy & Precautions, H&P , NPO status , Patient's Chart, lab work & pertinent test results  History of Anesthesia Complications Negative for: history of anesthetic complications  Airway Mallampati: II TM Distance: >3 FB Neck ROM: Full    Dental  (+) Teeth Intact and Dental Advisory Given   Pulmonary neg pulmonary ROS,    Pulmonary exam normal       Cardiovascular negative cardio ROS      Neuro/Psych negative neurological ROS  negative psych ROS   GI/Hepatic   Endo/Other  negative endocrine ROS  Renal/GU negative Renal ROS     Musculoskeletal   Abdominal   Peds  Hematology negative hematology ROS (+)   Anesthesia Other Findings   Reproductive/Obstetrics                           Anesthesia Physical Anesthesia Plan  ASA: II  Anesthesia Plan: General   Post-op Pain Management:    Induction: Intravenous  Airway Management Planned: Oral ETT  Additional Equipment:   Intra-op Plan:   Post-operative Plan:   Informed Consent: I have reviewed the patients History and Physical, chart, labs and discussed the procedure including the risks, benefits and alternatives for the proposed anesthesia with the patient or authorized representative who has indicated his/her understanding and acceptance.   Dental advisory given  Plan Discussed with: CRNA, Anesthesiologist and Surgeon  Anesthesia Plan Comments: (Sister present who helped with communication.)        Anesthesia Quick Evaluation

## 2012-06-18 ENCOUNTER — Encounter (HOSPITAL_COMMUNITY): Payer: Self-pay | Admitting: *Deleted

## 2012-06-18 MED ORDER — OXYCODONE-ACETAMINOPHEN 5-325 MG PO TABS: 1.0000 | ORAL_TABLET | ORAL | Status: DC | PRN

## 2012-06-18 NOTE — Progress Notes (Signed)
Discharge instructions gone over with patient and patient's daughter. Home medications gone over. Prescription given to patient. Follow up appointment to be made. Diet, activity, and incisional care gone over. My chart discussed. Signs and symptoms of infection and when to call the doctor gone over. Patient verbalized understanding of instructions.

## 2012-06-18 NOTE — Discharge Summary (Signed)
Physician Discharge Summary  Patient ID: Julia Espinoza MRN: [id number] DOB/AGE: 40-Sep-1972 40 y.o.  Admit date: 06/17/2012 Discharge date: 06/18/2012  Admission Diagnoses:  Discharge Diagnoses:  Active Problems:   Symptomatic cholelithiasis   Transaminitis   Discharged Condition: good  Hospital Course: The patient was admitted to the hospital with RUQ pain.  She was taken to the OR the same day for lap chole.  She tolerated the procedure well.  By POD 1 she was tolerating a reg diet, her pain was controlled with PO meds and she was ambulating without difficulty.    Consults: None  Significant Diagnostic Studies: labs: cbc, chem, lft's  Treatments: surgery: lap chole  Discharge Exam: Blood pressure 90/54, pulse 64, temperature 98.2 F (36.8 C), temperature source Oral, resp. rate 19, height 5\' 3"  (1.6 m), weight 130 lb (58.968 kg), last menstrual period 05/08/2012, SpO2 100.00%. General appearance: alert and cooperative GI: soft, appropriately tender; bowel sounds normal Incision/Wound: c/d/i  Disposition:   Discharge Orders   Future Orders Complete By Expires     Discharge patient  As directed         Medication List    TAKE these medications       oxyCODONE-acetaminophen 5-325 MG per tablet  Commonly known as:  PERCOCET/ROXICET  Take 1-2 tablets by mouth every 4 (four) hours as needed.           Follow-up Information   Follow up with Lajean Saver, MD. Schedule an appointment as soon as possible for a visit in 2 weeks.   Contact information:   [address] [place] [address] [phone]       Signed: Vanita Panda 06/18/2012, 9:22 AM

## 2012-06-20 ENCOUNTER — Encounter (HOSPITAL_COMMUNITY): Payer: Self-pay | Admitting: General Surgery

## 2012-07-01 ENCOUNTER — Telehealth (INDEPENDENT_AMBULATORY_CARE_PROVIDER_SITE_OTHER): Payer: Self-pay | Admitting: General Surgery

## 2012-07-01 NOTE — Telephone Encounter (Signed)
Called and spoke with patient's daughter to see if patient could come in sooner for Monday 6/16 appt...daughter stated this would be fine and they would be here 6/16 @11 :15

## 2012-07-04 ENCOUNTER — Ambulatory Visit (INDEPENDENT_AMBULATORY_CARE_PROVIDER_SITE_OTHER): Payer: Self-pay | Admitting: General Surgery

## 2012-07-04 ENCOUNTER — Encounter (INDEPENDENT_AMBULATORY_CARE_PROVIDER_SITE_OTHER): Payer: Self-pay | Admitting: General Surgery

## 2012-07-04 VITALS — BP 100/62 | HR 76 | Temp 97.2°F | Resp 16 | Ht 61.0 in | Wt 145.2 lb

## 2012-07-04 DIAGNOSIS — Z9049 Acquired absence of other specified parts of digestive tract: Secondary | ICD-10-CM

## 2012-07-04 DIAGNOSIS — Z9889 Other specified postprocedural states: Secondary | ICD-10-CM

## 2012-07-04 NOTE — Progress Notes (Signed)
Patient ID: Julia Espinoza, female   DOB: Nov 19, 1970, 42 y.o.   MRN: [id number] The patient is a 42 year old female status post laparoscopic cholecystectomy. The patient has been doing well postoperatively. Patient is having no trouble with her diet her bowel function.  On exam: Her wounds are clean dry and intact.  Pathology: Reveals cholelithiasis and chronic cholecystitis.no tumor seen.This was discussed with the patient.  Assessment and plan: 42 year old female status post laparoscopic cholecystectomy 1. We discussed with prescriptions for another month 2. Patient follow up as needed

## 2014-01-04 ENCOUNTER — Other Ambulatory Visit: Payer: Self-pay | Admitting: Student

## 2014-01-04 DIAGNOSIS — R1030 Lower abdominal pain, unspecified: Secondary | ICD-10-CM

## 2014-01-09 ENCOUNTER — Other Ambulatory Visit: Payer: Medicaid Other

## 2014-07-21 ENCOUNTER — Encounter (HOSPITAL_COMMUNITY): Payer: Self-pay | Admitting: *Deleted

## 2014-07-21 ENCOUNTER — Emergency Department (HOSPITAL_COMMUNITY)
Admission: EM | Admit: 2014-07-21 | Discharge: 2014-07-22 | Disposition: A | Discharge status: Home / Self Care | Payer: Self-pay | Attending: Emergency Medicine | Admitting: Emergency Medicine

## 2014-07-21 ENCOUNTER — Emergency Department (HOSPITAL_COMMUNITY): Payer: Medicaid Other

## 2014-07-21 DIAGNOSIS — Z9889 Other specified postprocedural states: Secondary | ICD-10-CM | POA: Insufficient documentation

## 2014-07-21 DIAGNOSIS — R102 Pelvic and perineal pain: Secondary | ICD-10-CM | POA: Insufficient documentation

## 2014-07-21 DIAGNOSIS — R110 Nausea: Secondary | ICD-10-CM | POA: Insufficient documentation

## 2014-07-21 DIAGNOSIS — Z3202 Encounter for pregnancy test, result negative: Secondary | ICD-10-CM | POA: Insufficient documentation

## 2014-07-21 DIAGNOSIS — Z9049 Acquired absence of other specified parts of digestive tract: Secondary | ICD-10-CM | POA: Insufficient documentation

## 2014-07-21 LAB — URINE MICROSCOPIC-ADD ON

## 2014-07-21 LAB — COMPREHENSIVE METABOLIC PANEL
ALK PHOS: 55 U/L (ref 38–126)
ALT: 31 U/L (ref 14–54)
AST: 30 U/L (ref 15–41)
Albumin: 4.3 g/dL (ref 3.5–5.0)
Anion gap: 10 (ref 5–15)
BUN: 12 mg/dL (ref 6–20)
CALCIUM: 9.5 mg/dL (ref 8.9–10.3)
CHLORIDE: 106 mmol/L (ref 101–111)
CO2: 24 mmol/L (ref 22–32)
CREATININE: 0.85 mg/dL (ref 0.44–1.00)
GFR calc non Af Amer: >60 mL/min (ref >60)
GLUCOSE: 93 mg/dL (ref 65–99)
POTASSIUM: 3.1 mmol/L — AB (ref 3.5–5.1)
Sodium: 140 mmol/L (ref 135–145)
TOTAL PROTEIN: 7.8 g/dL (ref 6.5–8.1)
Total Bilirubin: 0.5 mg/dL (ref 0.3–1.2)

## 2014-07-21 LAB — WET PREP, GENITAL
Clue Cells Wet Prep HPF POC: NONE SEEN
Trich, Wet Prep: NONE SEEN
YEAST WET PREP: NONE SEEN

## 2014-07-21 LAB — CBC WITH DIFFERENTIAL/PLATELET
BASOS PCT: 0 % (ref 0–1)
Basophils Absolute: 0 10*3/uL (ref 0.0–0.1)
EOS ABS: 0.1 10*3/uL (ref 0.0–0.7)
Eosinophils Relative: 1 % (ref 0–5)
HCT: 40.7 % (ref 36.0–46.0)
HEMOGLOBIN: 14.1 g/dL (ref 12.0–15.0)
LYMPHS PCT: 28 % (ref 12–46)
Lymphs Abs: 2.1 10*3/uL (ref 0.7–4.0)
MCH: 30.2 pg (ref 26.0–34.0)
MCHC: 34.6 g/dL (ref 30.0–36.0)
MCV: 87.2 fL (ref 78.0–100.0)
MONOS PCT: 6 % (ref 3–12)
Monocytes Absolute: 0.5 10*3/uL (ref 0.1–1.0)
NEUTROS ABS: 4.8 10*3/uL (ref 1.7–7.7)
Neutrophils Relative %: 65 % (ref 43–77)
Platelets: 268 10*3/uL (ref 150–400)
RBC: 4.67 MIL/uL (ref 3.87–5.11)
RDW: 12.8 % (ref 11.5–15.5)
WBC: 7.5 10*3/uL (ref 4.0–10.5)

## 2014-07-21 LAB — URINALYSIS, ROUTINE W REFLEX MICROSCOPIC
Bilirubin Urine: NEGATIVE
Glucose, UA: NEGATIVE mg/dL
Ketones, ur: 15 mg/dL — AB
LEUKOCYTES UA: NEGATIVE
Nitrite: NEGATIVE
Protein, ur: NEGATIVE mg/dL
SPECIFIC GRAVITY, URINE: 1.025 (ref 1.005–1.030)
Urobilinogen, UA: 0.2 mg/dL (ref 0.0–1.0)
pH: 6 (ref 5.0–8.0)

## 2014-07-21 LAB — POC URINE PREG, ED: Preg Test, Ur: NEGATIVE

## 2014-07-21 MED ORDER — POTASSIUM CHLORIDE CRYS ER 20 MEQ PO TBCR
40.0000 meq | EXTENDED_RELEASE_TABLET | Freq: Once | ORAL | Status: AC
  Administered 2014-07-21: 40 meq via ORAL
  Filled 2014-07-21: qty 2

## 2014-07-21 MED ORDER — ONDANSETRON HCL 4 MG/2ML IJ SOLN
4.0000 mg | Freq: Once | INTRAMUSCULAR | Status: AC
  Administered 2014-07-21: 4 mg via INTRAVENOUS
  Filled 2014-07-21: qty 2

## 2014-07-21 MED ORDER — HYDROMORPHONE HCL 1 MG/ML IJ SOLN
0.5000 mg | Freq: Once | INTRAMUSCULAR | Status: AC
  Administered 2014-07-21: 0.5 mg via INTRAVENOUS
  Filled 2014-07-21: qty 1

## 2014-07-21 NOTE — ED Notes (Signed)
Arrived back from U/S at this time.

## 2014-07-21 NOTE — ED Provider Notes (Signed)
CSN: [id number]     Arrival date & time 07/21/14  1827 History   First MD Initiated Contact with Patient 07/21/14 1930     Chief Complaint  Patient presents with  . Abdominal Pain  . Emesis     (Consider location/radiation/quality/duration/timing/severity/associated sxs/prior Treatment) HPI  Pt presenting with c/o suprapubic pain over the past 2 weeks.  Pt states pain is intermittent and when it is at its worst she has nausea. Some pain also in right flank.  No dysuria, denies urgency or frequency.  No fever/chills.  She also has some vaginal bleeding starting yesterday but states this is her usual menses.  No diarrhea or change in stools.  Pain is sharp and cramping in nature.  She has not had any treatment prior to arrival.  There are no other associated systemic symptoms, there are no other alleviating or modifying factors.  History reviewed. No pertinent past medical history. Past Surgical History  Procedure Laterality Date  . Cholecystectomy N/A 06/17/2012    Procedure: LAPAROSCOPIC CHOLECYSTECTOMY WITH INTRAOPERATIVE CHOLANGIOGRAM;  Surgeon: Axel Filler, MD;  Location: [facility] OR;  Service: General;  Laterality: N/A;  . Cesarean section     No family history on file. History  Substance Use Topics  . Smoking status: Never Smoker   . Smokeless tobacco: Never Used  . Alcohol Use: No   OB History    No data available     Review of Systems  ROS reviewed and all otherwise negative except for mentioned in HPI    Allergies  Review of patient's allergies indicates no known allergies.  Home Medications   Prior to Admission medications   Medication Sig Start Date End Date Taking? Authorizing Provider  acetaminophen (TYLENOL) 500 MG tablet Take 500 mg by mouth every 6 (six) hours as needed (pain).   Yes Historical Provider, MD  OVER THE COUNTER MEDICATION Take by mouth daily. 5 different vitamins from [employer]   Yes Historical Provider, MD  naproxen (NAPROSYN) 500 MG tablet Take 1  tablet (500 mg total) by mouth 2 (two) times daily. 07/22/14   Jerelyn Scott, MD  oxyCODONE-acetaminophen (PERCOCET/ROXICET) 5-325 MG per tablet Take 1-2 tablets by mouth every 4 (four) hours as needed. Patient not taking: Reported on 07/21/2014 06/18/12   Romie Levee, MD   BP 106/62 mmHg  Pulse 61  Temp(Src) 98.1 F (36.7 C) (Oral)  Resp 18  Wt 157 lb (71.215 kg)  SpO2 96%  LMP 06/20/2014  Vitals reviewed Physical Exam  Physical Examination: General appearance - alert, well appearing, and in no distress Mental status - alert, oriented to person, place, and time Eyes -no conjunctival injection, no scleral icterus Mouth - mucous membranes moist, pharynx normal without lesions Chest - clear to auscultation, no wheezes, rales or rhonchi, symmetric air entry Heart - normal rate, regular rhythm, normal S1, S2, no murmurs, rubs, clicks or gallops Abdomen - soft, nontender, nondistended, no masses or organomegaly Pelvic - normal external genitalia, vulva, vagina, cervix, uterus and adnexa, mild vaginal bleeding, no CMT, no adnexal tenderness, tenderness to palpation over fundus Neurological - alert, oriented, normal speech Extremities - peripheral pulses normal, no pedal edema, no clubbing or cyanosis Skin - normal coloration and turgor, no rashes  ED Course  Procedures (including critical care time) Labs Review Labs Reviewed  WET PREP, GENITAL - Abnormal; Notable for the following:    WBC, Wet Prep HPF POC FEW (*)    All other components within normal limits  COMPREHENSIVE METABOLIC PANEL -  Abnormal; Notable for the following:    Potassium 3.1 (*)    All other components within normal limits  URINALYSIS, ROUTINE W REFLEX MICROSCOPIC (NOT [facility]) - Abnormal; Notable for the following:    APPearance CLOUDY (*)    Hgb urine dipstick LARGE (*)    Ketones, ur 15 (*)    All other components within normal limits  CBC WITH DIFFERENTIAL/PLATELET  HIV ANTIBODY (ROUTINE TESTING)  URINE  MICROSCOPIC-ADD ON  POC URINE PREG, ED  GC/CHLAMYDIA PROBE AMP ([employer]) NOT [facility]    Imaging Review [place] Transvaginal Non-ob  07/22/2014   CLINICAL DATA:  Mid pelvic pain for 2 weeks  EXAM: TRANSABDOMINAL AND TRANSVAGINAL ULTRASOUND OF PELVIS  TECHNIQUE: Both transabdominal and transvaginal ultrasound examinations of the pelvis were performed. Transabdominal technique was performed for global imaging of the pelvis including uterus, ovaries, adnexal regions, and pelvic cul-de-sac. It was necessary to proceed with endovaginal exam following the transabdominal exam to visualize the ovaries.  COMPARISON:  None  FINDINGS: Uterus  Measurements: 9.8 x 3.7 x 6.0 cm. Nabothian cysts. No fibroids are other significant uterine lesions.  Endometrium  Thickness: 3.4 mm.  No focal abnormality visualized.  Right ovary  Measurements: 2.9 x 1.9 x 1.6 cm. Normal appearance/no adnexal mass.  Left ovary  Measurements: 3.1 x 1.3 x 1.8 cm. Normal appearance/no adnexal mass.  Other findings  No free fluid.  IMPRESSION: No significant abnormality.   Electronically Signed   By: Ellery Plunkaniel R Mitchell M.D.   On: 07/22/2014 00:27   [place] Pelvis Complete  07/22/2014   CLINICAL DATA:  Mid pelvic pain for 2 weeks  EXAM: TRANSABDOMINAL AND TRANSVAGINAL ULTRASOUND OF PELVIS  TECHNIQUE: Both transabdominal and transvaginal ultrasound examinations of the pelvis were performed. Transabdominal technique was performed for global imaging of the pelvis including uterus, ovaries, adnexal regions, and pelvic cul-de-sac. It was necessary to proceed with endovaginal exam following the transabdominal exam to visualize the ovaries.  COMPARISON:  None  FINDINGS: Uterus  Measurements: 9.8 x 3.7 x 6.0 cm. Nabothian cysts. No fibroids are other significant uterine lesions.  Endometrium  Thickness: 3.4 mm.  No focal abnormality visualized.  Right ovary  Measurements: 2.9 x 1.9 x 1.6 cm. Normal appearance/no adnexal mass.  Left ovary  Measurements: 3.1 x 1.3 x  1.8 cm. Normal appearance/no adnexal mass.  Other findings  No free fluid.  IMPRESSION: No significant abnormality.   Electronically Signed   By: Ellery Plunkaniel R Mitchell M.D.   On: 07/22/2014 00:27     EKG Interpretation None      MDM   Final diagnoses:  Pelvic pain in female    Pt presenting with c/o pelvic pain- she is also currently on her menses.  Pelvic exam with tenderness over uterus, no other abnormalities.  Pelvic ultrasound obtained and normal.  Low suspcion for torsion, pain is in midline and has been present x 2 weeks.  Not pregnant.  Pt feels imroved after meds in the ED.  She has an appontment scheduled with her PMD in 3 days.  Discharged with strict return precautions.  Pt agreeable with plan.    Jerelyn ScottMartha Linker, MD 07/22/14 [phone]

## 2014-07-21 NOTE — ED Notes (Signed)
Taken for U/S at this time. 

## 2014-07-21 NOTE — ED Notes (Signed)
Pt states suprapubic pain x 2 weeks and light vaginal bleeding since yesterday.  Pt states she has an US scheduled for Tues, but the pain has become unbearable.  States emesis when the pain is too great.  Denies changes in bowel or bladder habits.  Translation phone used.

## 2014-07-22 LAB — HIV ANTIBODY (ROUTINE TESTING W REFLEX): HIV Screen 4th Generation wRfx: NONREACTIVE

## 2014-07-22 MED ORDER — NAPROXEN 500 MG PO TABS: 500.0000 mg | ORAL_TABLET | Freq: Two times a day (BID) | ORAL | Status: DC

## 2014-07-22 NOTE — Discharge Instructions (Signed)
Return to the ED with any concerns including worsening pain, vomiting, bleeding and soaking more than one pad per hour, fainting, fever/chills, decreased level of alertness/lethargy, or any other alarming symptoms

## 2014-07-24 LAB — GC/CHLAMYDIA PROBE AMP (~~LOC~~) NOT AT ARMC
Chlamydia: NEGATIVE
Neisseria Gonorrhea: NEGATIVE

## 2015-01-10 ENCOUNTER — Encounter (HOSPITAL_COMMUNITY): Payer: Self-pay | Admitting: Vascular Surgery

## 2015-01-10 ENCOUNTER — Emergency Department (HOSPITAL_COMMUNITY)
Admission: EM | Admit: 2015-01-10 | Discharge: 2015-01-10 | Disposition: A | Discharge status: Home / Self Care | Payer: Medicaid Other | Attending: Emergency Medicine | Admitting: Emergency Medicine

## 2015-01-10 DIAGNOSIS — Z3202 Encounter for pregnancy test, result negative: Secondary | ICD-10-CM | POA: Insufficient documentation

## 2015-01-10 DIAGNOSIS — M5440 Lumbago with sciatica, unspecified side: Secondary | ICD-10-CM

## 2015-01-10 DIAGNOSIS — Z79899 Other long term (current) drug therapy: Secondary | ICD-10-CM | POA: Insufficient documentation

## 2015-01-10 DIAGNOSIS — Z791 Long term (current) use of non-steroidal anti-inflammatories (NSAID): Secondary | ICD-10-CM | POA: Insufficient documentation

## 2015-01-10 DIAGNOSIS — R112 Nausea with vomiting, unspecified: Secondary | ICD-10-CM | POA: Insufficient documentation

## 2015-01-10 DIAGNOSIS — M25551 Pain in right hip: Secondary | ICD-10-CM | POA: Insufficient documentation

## 2015-01-10 DIAGNOSIS — M25552 Pain in left hip: Secondary | ICD-10-CM | POA: Insufficient documentation

## 2015-01-10 LAB — URINALYSIS, ROUTINE W REFLEX MICROSCOPIC
Bilirubin Urine: NEGATIVE
GLUCOSE, UA: NEGATIVE mg/dL
Hgb urine dipstick: NEGATIVE
Ketones, ur: NEGATIVE mg/dL
Leukocytes, UA: NEGATIVE
Nitrite: NEGATIVE
PH: 7 (ref 5.0–8.0)
Protein, ur: NEGATIVE mg/dL
Specific Gravity, Urine: 1.008 (ref 1.005–1.030)

## 2015-01-10 LAB — POC URINE PREG, ED: Preg Test, Ur: NEGATIVE

## 2015-01-10 MED ORDER — IBUPROFEN 800 MG PO TABS: 800.0000 mg | ORAL_TABLET | Freq: Three times a day (TID) | ORAL | Status: DC

## 2015-01-10 MED ORDER — KETOROLAC TROMETHAMINE 30 MG/ML IJ SOLN
30.0000 mg | Freq: Once | INTRAMUSCULAR | Status: AC
  Administered 2015-01-10: 30 mg via INTRAMUSCULAR
  Filled 2015-01-10: qty 1

## 2015-01-10 MED ORDER — METHOCARBAMOL 500 MG PO TABS
750.0000 mg | ORAL_TABLET | Freq: Once | ORAL | Status: AC
  Administered 2015-01-10: 750 mg via ORAL
  Filled 2015-01-10: qty 2

## 2015-01-10 MED ORDER — METHOCARBAMOL 500 MG PO TABS: 500.0000 mg | ORAL_TABLET | Freq: Two times a day (BID) | ORAL | Status: DC

## 2015-01-10 NOTE — ED Notes (Signed)
Pt reports to the ED for eval of low back pain x 1 week. Denies any numbness, tingling, bowel or bladder incontinence, or paralysis. Pt denies any known injury. Pt A&Ox4, resp e/u, and skin warm and dry.

## 2015-01-10 NOTE — ED Notes (Signed)
See pa note.  

## 2015-01-10 NOTE — ED Provider Notes (Signed)
CSN: [id number]     Arrival date & time 01/10/15  1817 History  By signing my name below, I, Julia Espinoza, attest that this documentation has been prepared under the direction and in the presence of Linkoln Al[place]ire, [place]-C. Electronically Signed: Angelene GiovanniEmmanuella Espinoza, ED Scribe. 01/10/2015. 6:47 PM.      Chief Complaint  Patient presents with  . Back Pain   The history is provided by the patient. No language interpreter was used.   HPI Comments: Julia Espinoza is a 44 y.o. female who presents to the Emergency Department complaining of gradually worsening constant moderate lower back pain onset one week ago. She reports associated hip pain, bilateral feet tingling, intermittent lower abdominal pain, and nausea. She denies any recent trauma, injuries, or fall. She denies any dysuria, bowel/bladder incontinence, vaginal bleeding/discharge, vomiting, diarrhea, or numbness/weakness. No alleviating factors noted.    History reviewed. No pertinent past medical history. Past Surgical History  Procedure Laterality Date  . Cholecystectomy N/A 06/17/2012    Procedure: LAPAROSCOPIC CHOLECYSTECTOMY WITH INTRAOPERATIVE CHOLANGIOGRAM;  Surgeon: Axel FillerArmando Ramirez, MD;  Location: [facility] OR;  Service: General;  Laterality: N/A;  . Cesarean section     No family history on file. Social History  Substance Use Topics  . Smoking status: Never Smoker   . Smokeless tobacco: Never Used  . Alcohol Use: No   OB History    No data available     Review of Systems  Constitutional: Negative for fever and chills.  Gastrointestinal: Positive for nausea and abdominal pain. Negative for vomiting and diarrhea.  Genitourinary: Negative for dysuria and vaginal bleeding.  Musculoskeletal: Positive for back pain and arthralgias.  Neurological: Negative for numbness.  All other systems reviewed and are negative.     Allergies  Review of patient's allergies indicates no known allergies.  Home Medications   Prior to  Admission medications   Medication Sig Start Date End Date Taking? Authorizing Provider  acetaminophen (TYLENOL) 500 MG tablet Take 500 mg by mouth every 6 (six) hours as needed (pain).    Historical Provider, MD  naproxen (NAPROSYN) 500 MG tablet Take 1 tablet (500 mg total) by mouth 2 (two) times daily. 07/22/14   Jerelyn ScottMartha Linker, MD  OVER THE COUNTER MEDICATION Take by mouth daily. 5 different vitamins from [employer]    Historical Provider, MD  oxyCODONE-acetaminophen (PERCOCET/ROXICET) 5-325 MG per tablet Take 1-2 tablets by mouth every 4 (four) hours as needed. Patient not taking: Reported on 07/21/2014 06/18/12   Romie LeveeAlicia Thomas, MD   BP 113/69 mmHg  Pulse 76  Temp(Src) 97.3 F (36.3 C) (Oral)  Resp 16  SpO2 98% Physical Exam  Constitutional: She is oriented to person, place, and time. She appears well-developed and well-nourished. No distress.  HENT:  Head: Normocephalic and atraumatic.  Eyes: Conjunctivae and EOM are normal.  Neck: Neck supple. No tracheal deviation present.  Cardiovascular: Normal rate.   Pulmonary/Chest: Effort normal. No respiratory distress.  Abdominal: Soft. Bowel sounds are normal. There is no tenderness. There is no CVA tenderness.  Musculoskeletal: Normal range of motion.  Diffuse low back tenderness.   Neurological: She is alert and oriented to person, place, and time. She has normal strength. No cranial nerve deficit or sensory deficit. Coordination and gait normal.  Skin: Skin is warm and dry.  Psychiatric: She has a normal mood and affect. Her behavior is normal.  Nursing note and vitals reviewed.   ED Course  Procedures (including critical care time) DIAGNOSTIC STUDIES: Oxygen Saturation is 98%  on RA, normal by my interpretation.    COORDINATION OF CARE: 6:43 PM- Pt advised of plan for treatment and pt agrees. Pt will provide urine sample for further evaluation. Will order Toradol and Robaxin.    Labs Review Labs Reviewed  URINALYSIS, ROUTINE W REFLEX  MICROSCOPIC (NOT AT [facility])  POC URINE PREG, ED    Noelle Penner, PA-C has personally reviewed and evaluated these lab results as part of her medical decision-making.  MDM   Final diagnoses:  Low back pain with sciatica, sciatica laterality unspecified, unspecified back pain laterality   UA negative. Likely muscular strain/spasm with sciatica. No red flags for cauda equina, epidural abscess, other infectious/neoplastic etiology. No h/o injury or trauma warranting imaging. Will give rx for NSAIDs and robaxin. Resource guide given to establish PCP. ER return precautions given.   I personally performed the services described in this documentation, which was scribed in my presence. The recorded information has been reviewed and is accurate.    Carlene Coria, PA-C 01/10/15 1948  Laurence Spates, MD 01/11/15 [phone]

## 2015-01-10 NOTE — Discharge Instructions (Signed)
Your exam and urine test were normal. Your back pain is likely due to muscular strain/spasm. I am giving you prescriptions for ibuprofen (painkiller/anti-inflammatory) and Robaxin (muscle relaxant) to take as needed.   Take medications as prescribed. Return to the emergency room for worsening condition or new concerning symptoms. Follow up with your regular doctor. If you don't have a regular doctor use one of the numbers below to establish a primary care doctor.   Emergency Department Resource Guide 1) Find a Doctor and Pay Out of Pocket Although you won't have to find out who is covered by your insurance plan, it is a good idea to ask around and get recommendations. You will then need to call the office and see if the doctor you have chosen will accept you as a new patient and what types of options they offer for patients who are self-pay. Some doctors offer discounts or will set up payment plans for their patients who do not have insurance, but you will need to ask so you aren't surprised when you get to your appointment.  2) Contact Your Local Health Department Not all health departments have doctors that can see patients for sick visits, but many do, so it is worth a call to see if yours does. If you don't know where your local health department is, you can check in your phone book. The CDC also has a tool to help you locate your state's health department, and many state websites also have listings of all of their local health departments.  3) Find a Walk-in Clinic If your illness is not likely to be very severe or complicated, you may want to try a walk in clinic. These are popping up all over the country in pharmacies, drugstores, and shopping centers. They're usually staffed by nurse practitioners or physician assistants that have been trained to treat common illnesses and complaints. They're usually fairly quick and inexpensive. However, if you have serious medical issues or chronic medical  problems, these are probably not your best option.  No Primary Care Doctor: - Call Health Connect at  [phone] - they can help you locate a primary care doctor that  accepts your insurance, provides certain services, etc. - Physician Referral [phone]  Emergency Department Resource Guide 1) Find a Doctor and Pay Out of Pocket Although you won't have to find out who is covered by your insurance plan, it is a good idea to ask around and get recommendations. You will then need to call the office and see if the doctor you have chosen will accept you as a new patient and what types of options they offer for patients who are self-pay. Some doctors offer discounts or will set up payment plans for their patients who do not have insurance, but you will need to ask so you aren't surprised when you get to your appointment.  2) Contact Your Local Health Department Not all health departments have doctors that can see patients for sick visits, but many do, so it is worth a call to see if yours does. If you don't know where your local health department is, you can check in your phone book. The CDC also has a tool to help you locate your state's health department, and many state websites also have listings of all of their local health departments.  3) Find a Walk-in Clinic If your illness is not likely to be very severe or complicated, you may want to try a walk in clinic. These are  popping up all over the country in pharmacies, drugstores, and shopping centers. They're usually staffed by nurse practitioners or physician assistants that have been trained to treat common illnesses and complaints. They're usually fairly quick and inexpensive. However, if you have serious medical issues or chronic medical problems, these are probably not your best option.  No Primary Care Doctor: - Call Health Connect at  [phone] - they can help you locate a primary care doctor that  accepts your insurance, provides  certain services, etc. - Physician Referral Service- [phone]  Chronic Pain Problems: Organization         Address  Phone   Notes  [facility]  [phone] Patients need to be referred by their primary care doctor.   Medication Assistance: Organization         Address  Phone   Notes  [facility] [address] [place], [place] [address] [phone] --Must be a resident of [facility] [place] -- Must have NO insurance coverage whatsoever (no Medicaid/ Medicare, etc.) -- The pt. MUST have a primary care doctor that directs their care regularly and follows them in the community   [facility]ist  [phone]   [employer]  [phone]    Agencies that provide inexpensive medical care: Organization         Address  Phone   Notes  [facility]  [phone]   [facility]    [phone]   [facility] [address] [place], [place] [address] [phone]   [facility] [address], [place] [phone]   [facility]    [phone]   [facility]    [phone]   [facility]  [address], [place] Phone:  [phone], Fax:  [phone] Hours of Operation:  9 am - 6 pm, M-F.  Also accepts Medicaid/Medicare and self-pay.  [facility]  [address], [place] Phone: [phone], Fax: [phone]. Hours of Operation:  8:30 am - 5:30 pm, M-F.  Also accepts Medicaid and self-pay.  [facility] [place] [address], [place] Phone: [phone]   [facility] [address], [place], [place] [phone], Ext. [phone] Mondays & Thursdays: 7-9 AM.  First 15 patients are seen on a first come, first serve basis.    Medicaid-accepting [facility] Providers:  Organization         Address  Phone   Notes  [facility] [address], [place] [phone] Also accepts self-pay patients.  [facility] [address], [place]  [phone]   [facility] [address], [place] [phone]   [facility] [address] [place] [address], [place] [phone]   [facility] RakersVe[facility] Bland [address], [place]   [phone] Only accepts [place] patients after they have their name applied to their card.   Self-Pay (no insurance) in [facility]:  Organization         Address  Phone   Notes  Sickle Cell Patients, [facility] [address], [place] [phone]   [facility] [address], [place] [phone]   [facility] [place], [address],  [phone]   [facility]/Dr. Osei-Bonsu  [address] [place] or [address], [place] [phone] Phone number for both [place] and [place] locations is the same.  [facility] [address], [place] [phone]   [facility] [address], [place] or [address] [phone]   [facility] [address] [place] [phone], phone; [phone], fax Sees patients 1st and 3rd Saturday of every month.  Must not qualify for public or private insurance (i.e. Medicaid, Medicare, [place] Health Choice, Veterans' Benefits)  Household income should be no more than 200% of the poverty level The clinic cannot treat you if you are pregnant or think you are pregnant  Sexually transmitted diseases are not treated at the clinic.

## 2015-06-21 ENCOUNTER — Other Ambulatory Visit (HOSPITAL_COMMUNITY): Payer: Self-pay | Admitting: Physician Assistant

## 2015-06-21 DIAGNOSIS — R1030 Lower abdominal pain, unspecified: Secondary | ICD-10-CM

## 2015-06-27 ENCOUNTER — Ambulatory Visit (HOSPITAL_COMMUNITY)
Admission: RE | Admit: 2015-06-27 | Discharge: 2015-06-27 | Disposition: A | Discharge status: Home / Self Care | Payer: Medicaid Other | Source: Ambulatory Visit | Attending: Physician Assistant | Admitting: Physician Assistant

## 2015-06-27 DIAGNOSIS — R1030 Lower abdominal pain, unspecified: Secondary | ICD-10-CM | POA: Insufficient documentation

## 2015-06-27 DIAGNOSIS — N83202 Unspecified ovarian cyst, left side: Secondary | ICD-10-CM | POA: Insufficient documentation

## 2015-07-10 ENCOUNTER — Inpatient Hospital Stay: Payer: Self-pay | Attending: Obstetrics and Gynecology | Admitting: Obstetrics and Gynecology

## 2015-07-10 VITALS — BP 104/70 | HR 57 | Ht 62.0 in | Wt 156.5 lb

## 2015-07-10 DIAGNOSIS — R938 Abnormal findings on diagnostic imaging of other specified body structures: Secondary | ICD-10-CM | POA: Insufficient documentation

## 2015-07-10 DIAGNOSIS — O99284 Endocrine, nutritional and metabolic diseases complicating childbirth: Secondary | ICD-10-CM

## 2015-07-10 DIAGNOSIS — R102 Pelvic and perineal pain: Secondary | ICD-10-CM | POA: Insufficient documentation

## 2015-07-10 DIAGNOSIS — E039 Hypothyroidism, unspecified: Secondary | ICD-10-CM | POA: Insufficient documentation

## 2015-07-10 DIAGNOSIS — R740 Nonspecific elevation of levels of transaminase and lactic acid dehydrogenase [LDH]: Secondary | ICD-10-CM | POA: Insufficient documentation

## 2015-07-10 DIAGNOSIS — N83202 Unspecified ovarian cyst, left side: Secondary | ICD-10-CM | POA: Insufficient documentation

## 2015-07-10 DIAGNOSIS — N946 Dysmenorrhea, unspecified: Secondary | ICD-10-CM | POA: Insufficient documentation

## 2015-07-10 DIAGNOSIS — M25559 Pain in unspecified hip: Secondary | ICD-10-CM | POA: Insufficient documentation

## 2015-07-10 DIAGNOSIS — N939 Abnormal uterine and vaginal bleeding, unspecified: Secondary | ICD-10-CM | POA: Insufficient documentation

## 2015-07-10 DIAGNOSIS — N888 Other specified noninflammatory disorders of cervix uteri: Secondary | ICD-10-CM | POA: Insufficient documentation

## 2015-07-10 NOTE — Progress Notes (Signed)
  Oncology Nurse Navigator Documentation  Navigator Location: [facility] Onc (07/10/15 1600) Navigator Encounter Type: Clinic/MDC (GYN Onc) (07/10/15 1600)   Abnormal Finding Date: 06/27/15 (07/10/15 1600)       Patient Visit Type: Initial (Gyn Onc) (07/10/15 1600)   Barriers/Navigation Needs: Coordination of Care;Education (07/10/15 1600)   Interventions: Coordination of Care (07/10/15 1600)            Acuity: Level 2 (07/10/15 1600)   Acuity Level 2: Initial guidance, education and coordination as needed;Educational needs;Ongoing guidance and education throughout treatment as needed;Other (obtaining medical records) (07/10/15 1600)     Time Spent with Patient: 60 (07/10/15 1600)   Chaperoned exam and biopsies. Endometrial and endocervical biopsies obtained and sent to pathology. Will obtain medical records for [facility].

## 2015-07-10 NOTE — Progress Notes (Signed)
Gynecologic Oncology Consult Visit   Referring Provider:  [facility], [place]ige Fricke, [place]  Chief Concern: endometrial lesion  Subjective:  Julia Espinoza is a 45 y.o. female G9P8 who is seen in consultation from Dr. Mauri ReadingFricke for pelvic pain and abnormal pelvic US.  Patient has had pelvic pain for five years that is worse with menses and sometimes worse with urination.  [facility].    Generally normal periods lasting 3-4 days.  Missed period in May and then the last one two weeks ago after pelvic US heavier than usual and lasting 6 days. No bleeding in between periods.   She had LS choly 2015 and C section and BTL in past.  Also has some dizziness and takes ibuprofen and meclizine.  Subclinical hypothyroidism.  Mother had some type of gyn cancer at a young age.    06/27/15 Ultrasound FINDINGS: UterusMeasurements: 10.6 x 4.4 x 5.5 cm . Septated cystic structure within the cervix measures 3.6 x 2.9 x 3.3 cm. Previously 2.5 x 1.6 x 2.8 cm.  Endometrium: Thickness: 2 cm. Complex echogenic structure within the endometrial canal measures 1.6 x 1.2 x 2.7 cm.  Right ovary: Measurements: Not visualize. No adnexal mass.  Left ovary: Measurements: 3.4 x 1.4 x 3.3 cm. There is a cyst within the left ovary measuring 2.9 x 1.3 x 2.5 cm.  Other findings: No abnormal free fluid.  IMPRESSION: 1. A focal endometrial lesion is suspected. Consider sonohysterogram for further evaluation, prior to hysteroscopy or endometrial biopsy. 2. Cystic structure in the region of the cervix likely reflects a complex nabothian cyst is increased in volume from previous exam. 3. Left ovary cyst.  7/16 US Uterus: Measurements: 9.8 x 3.7 x 6.0 cm. Nabothian cysts. No fibroids are other significant uterine lesions.  Endometrium: Thickness: 3.4 mm. No focal abnormality visualized.  Right ovary: Measurements: 2.9 x 1.9 x 1.6 cm. Normal appearance/no adnexal  mass.  Left ovary: Measurements: 3.1 x 1.3 x 1.8 cm. Normal appearance/no adnexal mass.  Other findings: No free fluid.  IMPRESSION: No significant abnormality.  Problem List: Patient Active Problem List   Diagnosis Date Noted  . Symptomatic cholelithiasis 06/17/2012  . Transaminitis 06/17/2012    Past Medical History: Past Medical History  Diagnosis Date  . Thyroid disease     Past Surgical History: Past Surgical History  Procedure Laterality Date  . Cholecystectomy N/A 06/17/2012    Procedure: LAPAROSCOPIC CHOLECYSTECTOMY WITH INTRAOPERATIVE CHOLANGIOGRAM;  Surgeon: Axel FillerArmando Ramirez, MD;  Location: [facility] OR;  Service: General;  Laterality: N/A;  . Cesarean section      Family History: No family history on file.  Social History: Social History   Social History  . Marital Status: Single    Spouse Name: N/A  . Number of Children: N/A  . Years of Education: N/A   Occupational History  . Not on file.   Social History Main Topics  . Smoking status: Never Smoker   . Smokeless tobacco: Never Used  . Alcohol Use: No  . Drug Use: No  . Sexual Activity: Not on file   Other Topics Concern  . Not on file   Social History Narrative    Allergies: No Known Allergies  Current Medications: Current Outpatient Prescriptions  Medication Sig Dispense Refill  . acetaminophen (TYLENOL) 500 MG tablet Take 500 mg by mouth every 6 (six) hours as needed (pain). Reported on 07/10/2015    . ibuprofen (ADVIL,MOTRIN) 800 MG tablet Take 1 tablet (  800 mg total) by mouth 3 (three) times daily. (Patient not taking: Reported on 07/10/2015) 21 tablet 0  . methocarbamol (ROBAXIN) 500 MG tablet Take 1 tablet (500 mg total) by mouth 2 (two) times daily. (Patient not taking: Reported on 07/10/2015) 10 tablet 0  . naproxen (NAPROSYN) 500 MG tablet Take 1 tablet (500 mg total) by mouth 2 (two) times daily. (Patient not taking: Reported on 07/10/2015) 30 tablet 0  . OVER THE COUNTER MEDICATION Take  by mouth daily. Reported on 07/10/2015    . oxyCODONE-acetaminophen (PERCOCET/ROXICET) 5-325 MG per tablet Take 1-2 tablets by mouth every 4 (four) hours as needed. (Patient not taking: Reported on 07/21/2014) 30 tablet 0   No current facility-administered medications for this visit.    Review of Systems General: negative for, fevers, chills, fatigue, changes in sleep, changes in weight or appetite Skin: negative for changes in color, texture, moles or lesions Eyes: negative for, changes in vision, pain, diplopia HEENT: negative for, change in hearing, pain, discharge, tinnitus, vertigo, voice changes, sore throat, neck masses Breasts: negative for breast lumps Pulmonary: negative for, dyspnea, orthopnea, productive cough Cardiac: negative for, palpitations, syncope, pain, discomfort, pressure Gastrointestinal: negative for, dysphagia, nausea, vomiting, jaundice, pain, constipation, diarrhea, hematemesis, hematochezia Genitourinary/Sexual: negative for, dysuria, discharge, hesitancy, nocturia, retention, stones, infections, STD's, incontinence Ob/Gyn: no hot flashes Musculoskeletal: negative for, pain, stiffness, swelling, range of motion limitation Hematology: No coagulation disorder, negative for, easy bruising, bleeding Neurologic/Psych: negative for, headaches, seizures, paralysis, weakness, tremor, change in gait, change in sensation, mood swings, depression, anxiety, change in memory  Objective:  Physical Examination:  BP 104/70 mmHg  Pulse 57  Ht  (1.575 m)  Wt 156 lb 8.4 oz (71 kg)  BMI 28.62 kg/m2   ECOG Performance Status: 1 - Symptomatic but completely ambulatory  General appearance: alert, cooperative and appears stated age HEENT:PERRLA, neck supple with midline trachea and thyroid without masses Lymph node survey: non-palpable, axillary, inguinal, supraclavicular Cardiovascular: regular rate and rhythm, no murmurs or gallops Respiratory: normal air entry, lungs  clear to auscultation and no rales, rhonchi or wheezing Breast exam: not examined. Abdomen: soft, non-tender, without masses or organomegaly, no hernias and well healed incision from LS Back: inspection of back is normal Extremities: extremities normal, atraumatic, no cyanosis or edema Skin exam - normal coloration and turgor, no rashes, no suspicious skin lesions noted. Neurological exam reveals alert, oriented, normal speech, no focal findings or movement disorder noted.  Pelvic: exam chaperoned by nurse;  Vulva: normal appearing vulva with no masses, tenderness or lesions; Vagina: normal vagina; Adnexa: normal adnexa in size, nontender and no masses; Uterus: uterus is upper limit of normal size, nontender; Cervix: anteverted; Rectal: normal rectal, no masses  Procedure note: After written informed consent with interpreter an ECC and endometrial biopsy were performed after cleaning Cx with betadine.  The uterus sounded to about 10 cm and minimal tissue was obtained with pipelle.  The endocervix was a bit patulous and firm.  An ECC was done. Patient tolerated the procedure well and there was minimal bleeding.  Assessment:  Julia Espinoza is a 45 y.o. P12 female diagnosed with chronic pelvic pain and concern for endometrial lesion on recent pelvic ultrasound.  She had missed a period before the ultrasound and then had a heavier one right after that.  Her history is concerning for adenomyosis and/or endometriosis and it was surprising that her uterine cavity sounded to 10 cm. She also appears to have nabothian cysts in the  cervix on [place].  Low suspicion for cancer with no bleeding between periods, but still possible.   Plan:   Problem List Items Addressed This Visit      Genitourinary   Dysmenorrhea   Relevant Orders   Surgical pathology   Surgical pathology   Abnormal uterine bleeding   Relevant Orders   Surgical pathology   Surgical pathology     Other   Pain in joint, pelvic  region and thigh - Primary   Relevant Orders   Surgical pathology   Surgical pathology     We did ECC and endometrial biopsy today and will await results.  She thought her last PAP was 12/16, but it was in 2014 (normal).  I think she will likely benefit from a hysterectomy regardless of the results in view of pain and large endometrial cavity on sounding today.  Again, suspect she has adenomyosis.  Will contact her with results and then make further recommendations.  Would have done another PAP today, but she said last was in 12/16 and health dept says none since 2014.   The patient's diagnosis, an outline of the further diagnostic and laboratory studies which will be required, the recommendation, and alternatives were discussed with an interpreter.  All questions were answered to the patient's satisfaction.  A total of 45 minutes were spent with the patient/family today; 50% was spent in education, counseling and coordination of care.    Leida Lauth, MD  CC:  Rosemarie Beath, PA-C [address] [place], [place] [address] [phone]

## 2015-07-11 LAB — SURGICAL PATHOLOGY

## 2015-07-15 ENCOUNTER — Telehealth: Payer: Self-pay

## 2015-07-15 NOTE — Telephone Encounter (Signed)
  Oncology Nurse Navigator Documentation  Navigator Location: [facility] (07/15/15 1500) Navigator Encounter Type: Telephone;Diagnostic Results (07/15/15 1500) Telephone: Appt Confirmation/Clarification;Outgoing Call (07/15/15 1500)                                        Time Spent with Patient: 15 (07/15/15 1500)   With use of interpreter, notified patient that biopsies showed no signs of cancer. Dr Johnnette LitterBerchuck would like to see her July 12th to go over biopsy results and discuss further treament options. Appt arranged for July 12th at 1330

## 2015-07-31 ENCOUNTER — Encounter: Payer: Self-pay | Admitting: Obstetrics and Gynecology

## 2015-07-31 ENCOUNTER — Other Ambulatory Visit: Payer: Self-pay | Admitting: Obstetrics and Gynecology

## 2015-07-31 ENCOUNTER — Inpatient Hospital Stay: Payer: Self-pay | Attending: Obstetrics and Gynecology | Admitting: Obstetrics and Gynecology

## 2015-07-31 VITALS — BP 107/65 | HR 66 | Temp 97.0°F | Ht 62.0 in | Wt 158.1 lb

## 2015-07-31 DIAGNOSIS — N946 Dysmenorrhea, unspecified: Secondary | ICD-10-CM

## 2015-07-31 DIAGNOSIS — R102 Pelvic and perineal pain: Secondary | ICD-10-CM | POA: Insufficient documentation

## 2015-07-31 DIAGNOSIS — N83202 Unspecified ovarian cyst, left side: Secondary | ICD-10-CM | POA: Insufficient documentation

## 2015-07-31 DIAGNOSIS — N939 Abnormal uterine and vaginal bleeding, unspecified: Secondary | ICD-10-CM

## 2015-07-31 DIAGNOSIS — E039 Hypothyroidism, unspecified: Secondary | ICD-10-CM | POA: Insufficient documentation

## 2015-07-31 DIAGNOSIS — N920 Excessive and frequent menstruation with regular cycle: Secondary | ICD-10-CM | POA: Insufficient documentation

## 2015-07-31 NOTE — Progress Notes (Signed)
Patient here for follow  Patient experiencing severe pain in stomach, bowels and kidney area. Sever pain with bowel movements. Last period 6/23 was heavy and painful.

## 2015-07-31 NOTE — Progress Notes (Signed)
Gynecologic Oncology Consult Visit   Referring Provider:  [facility], [place], [place]  Chief Concern: menorrhagia, pelvic pain  Subjective:  Julia Espinoza is a 45 y.o. female G9P8 who is seen in consultation for pelvic pain and abnormal pelvic US.  Patient returns and continues to have pelvic pain and heavy menses lasting a long time. Endometrial and endocervical biopsies negative three weeks ago in clinic. She continues to have bleeding and pain and wants a hysterectomy.  History Patient has had pelvic pain for five years that is worse with menses and sometimes worse with urination.  [facility].    Generally normal periods lasting 3-4 days.  Missed period in May and then the last one two weeks ago after pelvic US heavier than usual and lasting 6 days. No bleeding in between periods.   She had LS choly 2015 and C section and BTL in past.  Also has some dizziness and takes ibuprofen and meclizine.  Subclinical hypothyroidism.  Mother had some type of gyn cancer at a young age.    06/27/15 Ultrasound FINDINGS: UterusMeasurements: 10.6 x 4.4 x 5.5 cm . Septated cystic structure within the cervix measures 3.6 x 2.9 x 3.3 cm. Previously 2.5 x 1.6 x 2.8 cm.  Endometrium: Thickness: 2 cm. Complex echogenic structure within the endometrial canal measures 1.6 x 1.2 x 2.7 cm.  Right ovary: Measurements: Not visualize. No adnexal mass.  Left ovary: Measurements: 3.4 x 1.4 x 3.3 cm. There is a cyst within the left ovary measuring 2.9 x 1.3 x 2.5 cm.  Other findings: No abnormal free fluid.  IMPRESSION: 1. A focal endometrial lesion is suspected. Consider sonohysterogram for further evaluation, prior to hysteroscopy or endometrial biopsy. 2. Cystic structure in the region of the cervix likely reflects a complex nabothian cyst is increased in volume from previous exam. 3. Left ovary cyst.  7/16 US Uterus: Measurements: 9.8 x 3.7 x  6.0 cm. Nabothian cysts. No fibroids are other significant uterine lesions.  Endometrium: Thickness: 3.4 mm. No focal abnormality visualized.  Right ovary: Measurements: 2.9 x 1.9 x 1.6 cm. Normal appearance/no adnexal mass.  Left ovary: Measurements: 3.1 x 1.3 x 1.8 cm. Normal appearance/no adnexal mass.  Other findings: No free fluid.  IMPRESSION: No significant abnormality.  Problem List: Patient Active Problem List   Diagnosis Date Noted  . Pain in joint, pelvic region and thigh 07/10/2015  . Dysmenorrhea 07/10/2015  . Abnormal uterine bleeding 07/10/2015  . Symptomatic cholelithiasis 06/17/2012  . Transaminitis 06/17/2012    Past Medical History: Past Medical History  Diagnosis Date  . Thyroid disease     Past Surgical History: Past Surgical History  Procedure Laterality Date  . Cholecystectomy N/A 06/17/2012    Procedure: LAPAROSCOPIC CHOLECYSTECTOMY WITH INTRAOPERATIVE CHOLANGIOGRAM;  Surgeon: Axel FillerArmando Ramirez, MD;  Location: [facility] OR;  Service: General;  Laterality: N/A;  . Cesarean section      Family History: No family history on file.  Social History: Social History   Social History  . Marital Status: Single    Spouse Name: N/A  . Number of Children: N/A  . Years of Education: N/A   Occupational History  . Not on file.   Social History Main Topics  . Smoking status: Never Smoker   . Smokeless tobacco: Never Used  . Alcohol Use: No  . Drug Use: No  . Sexual Activity: Not on file   Other Topics Concern  . Not on file  Social History Narrative    Allergies: No Known Allergies  Current Medications: Current Outpatient Prescriptions  Medication Sig Dispense Refill  . ibuprofen (ADVIL,MOTRIN) 800 MG tablet Take 1 tablet (800 mg total) by mouth 3 (three) times daily. 21 tablet 0  . acetaminophen (TYLENOL) 500 MG tablet Take 500 mg by mouth every 6 (six) hours as needed (pain). Reported on 07/31/2015    . methocarbamol (ROBAXIN) 500 MG tablet  Take 1 tablet (500 mg total) by mouth 2 (two) times daily. (Patient not taking: Reported on 07/10/2015) 10 tablet 0  . naproxen (NAPROSYN) 500 MG tablet Take 1 tablet (500 mg total) by mouth 2 (two) times daily. (Patient not taking: Reported on 07/10/2015) 30 tablet 0  . OVER THE COUNTER MEDICATION Take by mouth daily. Reported on 07/31/2015    . oxyCODONE-acetaminophen (PERCOCET/ROXICET) 5-325 MG per tablet Take 1-2 tablets by mouth every 4 (four) hours as needed. (Patient not taking: Reported on 07/21/2014) 30 tablet 0   No current facility-administered medications for this visit.    Review of Systems General: negative for, fevers, chills, fatigue, changes in sleep, changes in weight or appetite Skin: negative for changes in color, texture, moles or lesions Eyes: negative for, changes in vision, pain, diplopia HEENT: negative for, change in hearing, pain, discharge, tinnitus, vertigo, voice changes, sore throat, neck masses Breasts: negative for breast lumps Pulmonary: negative for, dyspnea, orthopnea, productive cough Cardiac: negative for, palpitations, syncope, pain, discomfort, pressure Gastrointestinal: negative for, dysphagia, nausea, vomiting, jaundice, pain, constipation, diarrhea, hematemesis, hematochezia Genitourinary/Sexual: negative for, dysuria, discharge, hesitancy, nocturia, retention, stones, infections, STD's, incontinence Ob/Gyn: no hot flashes Musculoskeletal: negative for, pain, stiffness, swelling, range of motion limitation Hematology: No coagulation disorder, negative for, easy bruising, bleeding Neurologic/Psych: negative for, headaches, seizures, paralysis, weakness, tremor, change in gait, change in sensation, mood swings, depression, anxiety, change in memory  Objective:  Physical Examination:  BP 107/65 mmHg  Pulse 66  Temp(Src) 97 F (36.1 C) (Tympanic)  Ht  (1.575 m)  Wt 158 lb 1.1 oz (71.7 kg)  BMI 28.90 kg/m2   ECOG Performance Status: 1 -  Symptomatic but completely ambulatory  General appearance: alert, cooperative and appears stated age HEENT:PERRLA, neck supple with midline trachea and thyroid without masses Lymph node survey: non-palpable, axillary, inguinal, supraclavicular Cardiovascular: regular rate and rhythm, no murmurs or gallops Respiratory: normal air entry, lungs clear to auscultation and no rales, rhonchi or wheezing Breast exam: not examined. Abdomen: soft, non-tender, without masses or organomegaly, no hernias and well healed incision from LS Back: inspection of back is normal Extremities: extremities normal, atraumatic, no cyanosis or edema Skin exam - normal coloration and turgor, no rashes, no suspicious skin lesions noted. Neurological exam reveals alert, oriented, normal speech, no focal findings or movement disorder noted.  Pelvic: exam chaperoned by nurse;  Vulva: normal appearing vulva with no masses, tenderness or lesions; Vagina: normal vagina; Adnexa: normal adnexa in size, nontender and no masses; Uterus: uterus is upper limit of normal size, nontender; Cervix: anteverted; Rectal: normal rectal, no masses  Assessment:  Markee Stephannie Peters is a 53 y.o. P39 female diagnosed with chronic pelvic pain and concern for endometrial lesion on recent pelvic ultrasound.  She had missed a period before the ultrasound and then had a heavier one right after that.  Her history is concerning for adenomyosis and/or endometriosis and it was surprising that her uterine cavity sounded to 10 cm. ECC and endometrial biopsy negative three weeks ago.  Plan:   Problem  List Items Addressed This Visit      Genitourinary   Dysmenorrhea   Abnormal uterine bleeding - Primary     She thought her last PAP was 12/16, but it was in 2014 (normal).  So we did a PAP today.  I think she will likely benefit from a hysterectomy, and suspect she has adenomyosis.  We will schedule her for TLH, BS on 08/28/15 and Dr Ranae Plumberhelsea Ward will be  the cosurgeon.  She understands the possibility of conversion to laparotomy and that if frozen section shows cancer, which is unlikely, we would do pelvic and possibly aortic node sampling.    The risks of surgery were discussed in detail with the translator and she understands these to include infection; wound separation; hernia; vaginal cuff separation, injury to adjacent organs such as bowel, bladder, blood vessels, ureters and nerves; bleeding which may require blood transfusion; anesthesia risk; thromboembolic events; possible death; unforeseen complications; possible need for re-exploration; medical complications such as heart attack, stroke, pleural effusion and pneumonia; and, if staging performed the risk of lymphedema and lymphocyst.  The patient will receive DVT and antibiotic prophylaxis as indicated.  She voiced a clear understanding.  She had the opportunity to ask questions and written informed consent was obtained today.  The patient's diagnosis, an outline of the further diagnostic and laboratory studies which will be required, the recommendation, and alternatives were discussed with an interpreter.  All questions were answered to the patient's satisfaction.  Leida LauthAndrew Aldahir Litaker, MD

## 2015-07-31 NOTE — Progress Notes (Signed)
  Oncology Nurse Navigator Documentation  Navigator Location: [facility] (07/31/15 1400) Navigator Encounter Type: Follow-up Appt (07/31/15 1400)       Surgery Date: 08/28/15 (07/31/15 1400)   Patient Visit Type: Follow-up (07/31/15 1400) Treatment Phase: Pre-Tx/Tx Discussion (07/31/15 1400) Barriers/Navigation Needs: Coordination of Care (07/31/15 1400)   Interventions: Coordination of Care (07/31/15 1400)   Coordination of Care: Other (surgery) (07/31/15 1400)                  Time Spent with Patient: 60 (07/31/15 1400)   Assisted with pelvic exam. Pap smear sent to lab. Arranging for surgery 08/28/15 with Dr Johnnette LitterBerchuck and Dr Elesa MassedWard assisting. Will contact patient regarding PAT when arranged.

## 2015-08-02 LAB — PAP LB AND HPV HIGH-RISK
HPV, high-risk: NEGATIVE
PAP Smear Comment: 0

## 2015-08-06 ENCOUNTER — Telehealth: Payer: Self-pay

## 2015-08-06 NOTE — Telephone Encounter (Signed)
  Oncology Nurse Navigator Documentation  Navigator Location: [facility] (08/06/15 1200) Navigator Encounter Type: Telephone;Letter/Fax/Email (08/06/15 1200) Telephone: Incoming Call;Outgoing Call (08/06/15 1200)             Barriers/Navigation Needs: Coordination of Care (08/06/15 1200)   Interventions: Coordination of Care (08/06/15 1200)   Coordination of Care: Other (surgery) (08/06/15 1200)                  Time Spent with Patient: 30 (08/06/15 1200)   [facility] OR has not received OR posting. [facility] of receiving.

## 2015-08-19 ENCOUNTER — Inpatient Hospital Stay: Admission: RE | Admit: 2015-08-19 | Payer: Self-pay | Source: Ambulatory Visit

## 2015-08-20 ENCOUNTER — Encounter
Admission: RE | Admit: 2015-08-20 | Discharge: 2015-08-20 | Disposition: A | Discharge status: Home / Self Care | Payer: Self-pay | Source: Ambulatory Visit | Attending: Obstetrics and Gynecology | Admitting: Obstetrics and Gynecology

## 2015-08-20 DIAGNOSIS — Z01812 Encounter for preprocedural laboratory examination: Secondary | ICD-10-CM | POA: Insufficient documentation

## 2015-08-20 HISTORY — DX: Adverse effect of unspecified anesthetic, initial encounter: T41.45XA

## 2015-08-20 HISTORY — DX: Other complications of anesthesia, initial encounter: T88.59XA

## 2015-08-20 LAB — TYPE AND SCREEN
ABO/RH(D): A POS
Antibody Screen: NEGATIVE

## 2015-08-20 LAB — COMPREHENSIVE METABOLIC PANEL
ALT: 24 U/L (ref 14–54)
AST: 26 U/L (ref 15–41)
Albumin: 4.1 g/dL (ref 3.5–5.0)
Alkaline Phosphatase: 68 U/L (ref 38–126)
Anion gap: 6 (ref 5–15)
BUN: 12 mg/dL (ref 6–20)
CHLORIDE: 102 mmol/L (ref 101–111)
CO2: 29 mmol/L (ref 22–32)
CREATININE: 0.73 mg/dL (ref 0.44–1.00)
Calcium: 8.9 mg/dL (ref 8.9–10.3)
GFR calc Af Amer: >60 mL/min (ref >60)
GFR calc non Af Amer: >60 mL/min (ref >60)
GLUCOSE: 82 mg/dL (ref 65–99)
Potassium: 4.1 mmol/L (ref 3.5–5.1)
SODIUM: 137 mmol/L (ref 135–145)
Total Bilirubin: 0.6 mg/dL (ref 0.3–1.2)
Total Protein: 8.2 g/dL — ABNORMAL HIGH (ref 6.5–8.1)

## 2015-08-20 LAB — CBC WITH DIFFERENTIAL/PLATELET
BASOS ABS: 0 10*3/uL (ref 0–0.1)
Basophils Relative: 1 %
EOS ABS: 0.3 10*3/uL (ref 0–0.7)
EOS PCT: 4 %
HCT: 38.1 % (ref 35.0–47.0)
Hemoglobin: 13.2 g/dL (ref 12.0–16.0)
LYMPHS PCT: 27 %
Lymphs Abs: 1.8 10*3/uL (ref 1.0–3.6)
MCH: 29.6 pg (ref 26.0–34.0)
MCHC: 34.5 g/dL (ref 32.0–36.0)
MCV: 85.7 fL (ref 80.0–100.0)
Monocytes Absolute: 0.4 10*3/uL (ref 0.2–0.9)
Monocytes Relative: 6 %
Neutro Abs: 4.1 10*3/uL (ref 1.4–6.5)
Neutrophils Relative %: 62 %
PLATELETS: 269 10*3/uL (ref 150–440)
RBC: 4.45 MIL/uL (ref 3.80–5.20)
RDW: 13.1 % (ref 11.5–14.5)
WBC: 6.5 10*3/uL (ref 3.6–11.0)

## 2015-08-20 LAB — URINALYSIS COMPLETE WITH MICROSCOPIC (ARMC ONLY)
BILIRUBIN URINE: NEGATIVE
Bacteria, UA: NONE SEEN
GLUCOSE, UA: NEGATIVE mg/dL
Ketones, ur: NEGATIVE mg/dL
LEUKOCYTES UA: NEGATIVE
NITRITE: NEGATIVE
Protein, ur: NEGATIVE mg/dL
SPECIFIC GRAVITY, URINE: 1.018 (ref 1.005–1.030)
pH: 6 (ref 5.0–8.0)

## 2015-08-20 LAB — PROTIME-INR
INR: 0.82
Prothrombin Time: 11.3 seconds — ABNORMAL LOW (ref 11.4–15.2)

## 2015-08-20 LAB — APTT: APTT: 34 s (ref 24–36)

## 2015-08-20 NOTE — Patient Instructions (Signed)
Your procedure is scheduled on: Wednesday 08/28/15 Su procedimiento est programado para: Report to Day Surgery. 2ND FLOOR MEDICAL MALL ENTRANCE Presntese a: To find out your arrival time please call [phone] between 1PM - 3PM on Tuesday 08/27/15. Para saber su hora de llegada por favor llame al [phone] entre la 1PM - 3PM el da:  Remember: Instructions that are not followed completely may result in serious medical risk, up to and including death, or upon the discretion of your surgeon and anesthesiologist your surgery may need to be rescheduled.  Recuerde: Las instrucciones que no se siguen completamente Armed forces logistics/support/administrative officer en un riesgo de salud grave, incluyendo hasta la [place] o a discrecin de su cirujano y Scientific laboratory technician, su ciruga se puede posponer.   __X__ 1. Do not eat food or drink liquids after midnight. No gum chewing or hard candies.  No coma alimentos ni tome lquidos despus de la medianoche.  No mastique chicle ni caramelos  duros.     __X__ 2. No alcohol for 24 hours before or after surgery.    No tome alcohol durante las 24 horas antes ni despus de la [place].   ____ 3. Bring all medications with you on the day of surgery if instructed.    Lleve todos los medicamentos con usted el da de su ciruga si se le ha indicado as.   __X__ 4. Notify your doctor if there is any change in your medical condition (cold, fever,                             infections).    Informe a su mdico si hay algn cambio en su condicin mdica (resfriado, fiebre, infecciones).   Do not wear jewelry, make-up, hairpins, clips or nail polish.  No use joyas, maquillajes, pinzas/ganchos para el cabello ni esmalte de uas.  Do not wear lotions, powders, or perfumes. You may wear deodorant.  No use lociones, polvos o perfumes.  Puede usar desodorante.    Do not shave 48 hours prior to surgery. Men may shave face and neck.  No se afeite 48 horas antes de la [place].  Los hombres pueden [employer] cara y el cuello.   Do not bring valuables to the hospital.   No lleve objetos de valor al hospital.  [facility] is not responsible for any belongings or valuables.  [place] no se hace responsable de ningn tipo de pertenencias u objetos de Licensed conveyancer.               Contacts, dentures or bridgework may not be worn into surgery.  Los lentes de [place], las dentaduras postizas o puentes no se pueden usar en la [place].  Leave your suitcase in the car. After surgery it may be brought to your room.  Deje su maleta en el auto.  Despus de la ciruga podr traerla a su habitacin.  For patients admitted to the hospital, discharge time is determined by your treatment team.  Para los pacientes que sean ingresados al hospital, el tiempo en el cual se le dar de alta es determinado por su                equipo de [place].   Patients discharged the day of surgery will not be allowed to drive home. A los pacientes que se les da de alta el mismo da de la ciruga no se les permitir conducir a Higher education careers adviser.   Please read over  the following fact sheets that you were given: Por favor lea las siguientes hojas de informacin que le dieron:   Surgical Site Infection Prevention   ____ Take these medicines the morning of surgery with A SIP OF WATER:          [employer] medicinas la maana de la ciruga con UN SORBO DE AGUA:  1. NONE  2.   3.   4.       5.  6.  ____ Fleet Enema (as directed)          Enema de Fleet (segn lo indicado)    __X__ Use CHG Soap as directed          Utilice el jabn de CHG segn lo indicado  ____ Use inhalers on the day of surgery          Use los inhaladores el da de la ciruga  ____ Stop metformin 2 days prior to surgery          Deje de tomar el metformin 2 das antes de la ciruga    ____ Take 1/2 of usual insulin dose the night before surgery and none on the morning of surgery           Tome la mitad de la dosis habitual de insulina la noche antes de la [place]  y no tome nada en la maana de la             ciruga  ____ Stop Coumadin/Plavix/aspirin on           Deje de tomar el Coumadin/Plavix/aspirina el da:  __X__ Stop Anti-inflammatories on TODAY          Deje de tomar antiinflamatorios el da:   ____ Stop supplements until after surgery            Deje de tomar suplementos hasta despus de la ciruga  ____ Bring C-Pap to the hospital          Lleve el C-Pap al hospital

## 2015-08-28 ENCOUNTER — Encounter: Payer: Self-pay | Admitting: *Deleted

## 2015-08-28 ENCOUNTER — Ambulatory Visit: Payer: Self-pay | Admitting: Anesthesiology

## 2015-08-28 ENCOUNTER — Encounter
Admission: RE | Disposition: A | Discharge status: Home / Self Care | Payer: Self-pay | Source: Ambulatory Visit | Attending: Obstetrics and Gynecology

## 2015-08-28 ENCOUNTER — Ambulatory Visit
Admission: RE | Admit: 2015-08-28 | Discharge: 2015-08-28 | Disposition: A | Discharge status: Home / Self Care | Payer: Self-pay | Source: Ambulatory Visit | Attending: Obstetrics and Gynecology | Admitting: Obstetrics and Gynecology

## 2015-08-28 DIAGNOSIS — N920 Excessive and frequent menstruation with regular cycle: Secondary | ICD-10-CM | POA: Insufficient documentation

## 2015-08-28 DIAGNOSIS — N946 Dysmenorrhea, unspecified: Secondary | ICD-10-CM | POA: Insufficient documentation

## 2015-08-28 DIAGNOSIS — R102 Pelvic and perineal pain: Secondary | ICD-10-CM | POA: Insufficient documentation

## 2015-08-28 DIAGNOSIS — N888 Other specified noninflammatory disorders of cervix uteri: Secondary | ICD-10-CM | POA: Insufficient documentation

## 2015-08-28 DIAGNOSIS — Z9049 Acquired absence of other specified parts of digestive tract: Secondary | ICD-10-CM | POA: Insufficient documentation

## 2015-08-28 DIAGNOSIS — E039 Hypothyroidism, unspecified: Secondary | ICD-10-CM | POA: Insufficient documentation

## 2015-08-28 DIAGNOSIS — G8929 Other chronic pain: Secondary | ICD-10-CM | POA: Insufficient documentation

## 2015-08-28 DIAGNOSIS — Z79899 Other long term (current) drug therapy: Secondary | ICD-10-CM | POA: Insufficient documentation

## 2015-08-28 DIAGNOSIS — K8020 Calculus of gallbladder without cholecystitis without obstruction: Secondary | ICD-10-CM | POA: Insufficient documentation

## 2015-08-28 DIAGNOSIS — N939 Abnormal uterine and vaginal bleeding, unspecified: Secondary | ICD-10-CM

## 2015-08-28 HISTORY — PX: LAPAROSCOPIC HYSTERECTOMY: SHX1926

## 2015-08-28 HISTORY — PX: PELVIC LYMPH NODE DISSECTION: SHX6543

## 2015-08-28 HISTORY — PX: CYSTOSCOPY: SHX5120

## 2015-08-28 LAB — POCT PREGNANCY, URINE: Preg Test, Ur: NEGATIVE

## 2015-08-28 SURGERY — HYSTERECTOMY, TOTAL, LAPAROSCOPIC
Anesthesia: General | Wound class: Clean Contaminated

## 2015-08-28 MED ORDER — FENTANYL CITRATE (PF) 100 MCG/2ML IJ SOLN
25.0000 ug | INTRAMUSCULAR | Status: DC | PRN
  Administered 2015-08-28 (×2): 50 ug via INTRAVENOUS

## 2015-08-28 MED ORDER — PROMETHAZINE HCL 25 MG/ML IJ SOLN
INTRAMUSCULAR | Status: AC
Start: 2015-08-28 — End: 2015-08-28
  Administered 2015-08-28: 12.5 mg via INTRAVENOUS
  Filled 2015-08-28: qty 1

## 2015-08-28 MED ORDER — CHLORHEXIDINE GLUCONATE CLOTH 2 % EX PADS: 6.0000 | MEDICATED_PAD | Freq: Once | CUTANEOUS | Status: DC

## 2015-08-28 MED ORDER — OXYCODONE HCL 5 MG/5ML PO SOLN: 5.0000 mg | Freq: Once | ORAL | Status: AC | PRN

## 2015-08-28 MED ORDER — ONDANSETRON HCL 4 MG/2ML IJ SOLN
INTRAMUSCULAR | Status: DC | PRN
  Administered 2015-08-28: 4 mg via INTRAVENOUS

## 2015-08-28 MED ORDER — SODIUM CHLORIDE FLUSH 0.9 % IV SOLN
INTRAVENOUS | Status: AC
  Administered 2015-08-28: 10 mL via INTRAVENOUS
  Filled 2015-08-28: qty 10

## 2015-08-28 MED ORDER — MIDAZOLAM HCL 2 MG/2ML IJ SOLN
INTRAMUSCULAR | Status: DC | PRN
  Administered 2015-08-28: 2 mg via INTRAVENOUS

## 2015-08-28 MED ORDER — ONDANSETRON HCL 4 MG/2ML IJ SOLN
4.0000 mg | Freq: Once | INTRAMUSCULAR | Status: AC
  Administered 2015-08-28: 4 mg via INTRAVENOUS

## 2015-08-28 MED ORDER — FLUORESCEIN SODIUM 10 % IV SOLN
INTRAVENOUS | Status: AC
  Filled 2015-08-28: qty 5

## 2015-08-28 MED ORDER — METHYLENE BLUE 0.5 % INJ SOLN
INTRAVENOUS | Status: AC
  Filled 2015-08-28: qty 10

## 2015-08-28 MED ORDER — LACTATED RINGERS IV SOLN
INTRAVENOUS | Status: DC | PRN
  Administered 2015-08-28: 07:00:00 via INTRAVENOUS

## 2015-08-28 MED ORDER — SUGAMMADEX SODIUM 200 MG/2ML IV SOLN
INTRAVENOUS | Status: DC | PRN
  Administered 2015-08-28: 140 mg via INTRAVENOUS

## 2015-08-28 MED ORDER — EPHEDRINE SULFATE 50 MG/ML IJ SOLN
INTRAMUSCULAR | Status: DC | PRN
  Administered 2015-08-28 (×2): 10 mg via INTRAVENOUS

## 2015-08-28 MED ORDER — BUPIVACAINE HCL (PF) 0.5 % IJ SOLN
INTRAMUSCULAR | Status: AC
  Filled 2015-08-28: qty 30

## 2015-08-28 MED ORDER — CEFAZOLIN SODIUM-DEXTROSE 2-4 GM/100ML-% IV SOLN
INTRAVENOUS | Status: AC
  Administered 2015-08-28: 2 g via INTRAVENOUS
  Filled 2015-08-28: qty 100

## 2015-08-28 MED ORDER — OXYCODONE HCL 5 MG PO TABS
5.0000 mg | ORAL_TABLET | Freq: Once | ORAL | Status: AC | PRN
  Administered 2015-08-28: 5 mg via ORAL

## 2015-08-28 MED ORDER — SODIUM CHLORIDE 0.9 % IJ SOLN
INTRAMUSCULAR | Status: AC
  Filled 2015-08-28: qty 10

## 2015-08-28 MED ORDER — CEFAZOLIN SODIUM-DEXTROSE 2-4 GM/100ML-% IV SOLN
2.0000 g | INTRAVENOUS | Status: AC
  Administered 2015-08-28: 2 g via INTRAVENOUS

## 2015-08-28 MED ORDER — ONDANSETRON HCL 4 MG/2ML IJ SOLN
INTRAMUSCULAR | Status: AC
Start: 2015-08-28 — End: 2015-08-28
  Administered 2015-08-28: 4 mg via INTRAVENOUS
  Filled 2015-08-28: qty 2

## 2015-08-28 MED ORDER — LIDOCAINE HCL (CARDIAC) 20 MG/ML IV SOLN
INTRAVENOUS | Status: DC | PRN
  Administered 2015-08-28: 100 mg via INTRAVENOUS

## 2015-08-28 MED ORDER — PROPOFOL 10 MG/ML IV BOLUS
INTRAVENOUS | Status: DC | PRN
  Administered 2015-08-28: 140 mg via INTRAVENOUS

## 2015-08-28 MED ORDER — PROMETHAZINE HCL 25 MG/ML IJ SOLN
12.5000 mg | Freq: Once | INTRAMUSCULAR | Status: AC
  Administered 2015-08-28: 12.5 mg via INTRAVENOUS

## 2015-08-28 MED ORDER — FAMOTIDINE 20 MG PO TABS
ORAL_TABLET | ORAL | Status: AC
  Administered 2015-08-28: 20 mg via ORAL
  Filled 2015-08-28: qty 1

## 2015-08-28 MED ORDER — IBUPROFEN 600 MG PO TABS: 600.0000 mg | ORAL_TABLET | Freq: Four times a day (QID) | ORAL | 0 refills | Status: AC | PRN

## 2015-08-28 MED ORDER — PHENYLEPHRINE HCL 10 MG/ML IJ SOLN
INTRAMUSCULAR | Status: DC | PRN
  Administered 2015-08-28: 100 ug via INTRAVENOUS

## 2015-08-28 MED ORDER — FAMOTIDINE 20 MG PO TABS
20.0000 mg | ORAL_TABLET | Freq: Once | ORAL | Status: AC
  Administered 2015-08-28: 20 mg via ORAL

## 2015-08-28 MED ORDER — SODIUM CHLORIDE 0.9% FLUSH
10.0000 mL | Freq: Three times a day (TID) | INTRAVENOUS | Status: DC
  Administered 2015-08-28: 10 mL via INTRAVENOUS

## 2015-08-28 MED ORDER — ROCURONIUM BROMIDE 100 MG/10ML IV SOLN
INTRAVENOUS | Status: DC | PRN
  Administered 2015-08-28: 30 mg via INTRAVENOUS
  Administered 2015-08-28: 50 mg via INTRAVENOUS

## 2015-08-28 MED ORDER — FENTANYL CITRATE (PF) 100 MCG/2ML IJ SOLN
INTRAMUSCULAR | Status: DC | PRN
  Administered 2015-08-28: 50 ug via INTRAVENOUS
  Administered 2015-08-28: 100 ug via INTRAVENOUS
  Administered 2015-08-28 (×2): 50 ug via INTRAVENOUS

## 2015-08-28 MED ORDER — BUPIVACAINE HCL (PF) 0.5 % IJ SOLN
INTRAMUSCULAR | Status: DC | PRN
  Administered 2015-08-28: 14 mL

## 2015-08-28 MED ORDER — FLEET ENEMA 7-19 GM/118ML RE ENEM: 1.0000 | ENEMA | Freq: Once | RECTAL | Status: DC

## 2015-08-28 MED ORDER — FENTANYL CITRATE (PF) 100 MCG/2ML IJ SOLN
INTRAMUSCULAR | Status: DC
Start: 2015-08-28 — End: 2015-08-28
  Filled 2015-08-28: qty 2

## 2015-08-28 MED ORDER — OXYCODONE HCL 5 MG PO TABS
ORAL_TABLET | ORAL | Status: AC
  Administered 2015-08-28: 5 mg via ORAL
  Filled 2015-08-28: qty 1

## 2015-08-28 MED ORDER — HYDROCODONE-ACETAMINOPHEN 5-325 MG PO TABS: 2.0000 | ORAL_TABLET | ORAL | 0 refills | Status: DC | PRN

## 2015-08-28 MED ORDER — LACTATED RINGERS IV SOLN
INTRAVENOUS | Status: DC
  Administered 2015-08-28 (×2): via INTRAVENOUS

## 2015-08-28 MED ORDER — DEXAMETHASONE SODIUM PHOSPHATE 10 MG/ML IJ SOLN
INTRAMUSCULAR | Status: DC | PRN
  Administered 2015-08-28: 10 mg via INTRAVENOUS

## 2015-08-28 SURGICAL SUPPLY — 81 items
APPLICATOR SURGIFLO (MISCELLANEOUS) IMPLANT
BAG URO DRAIN 2000ML W/SPOUT (MISCELLANEOUS) ×4 IMPLANT
BLADE SURG 15 STRL LF DISP TIS (BLADE) ×2 IMPLANT
BLADE SURG 15 STRL SS (BLADE) ×2
CANISTER SUCT 1200ML W/VALVE (MISCELLANEOUS) ×4 IMPLANT
CATH FOLEY 2WAY  5CC 16FR (CATHETERS) ×2
CATH TRAY 16F METER LATEX (MISCELLANEOUS) ×4 IMPLANT
CATH URTH 16FR FL 2W BLN LF (CATHETERS) ×2 IMPLANT
CHLORAPREP W/TINT 26ML (MISCELLANEOUS) ×4 IMPLANT
CNTNR SPEC 2.5X3XGRAD LEK (MISCELLANEOUS) ×2
CONT SPEC 4OZ STER OR WHT (MISCELLANEOUS) ×2
CONTAINER SPEC 2.5X3XGRAD LEK (MISCELLANEOUS) ×2 IMPLANT
CORD MONOPOLAR M/FML 12FT (MISCELLANEOUS) ×4 IMPLANT
DEFOGGER SCOPE WARMER CLEARIFY (MISCELLANEOUS) ×4 IMPLANT
DEVICE SUTURE ENDOST 10MM (ENDOMECHANICALS) ×4 IMPLANT
DRAPE LAP W/FLUID (DRAPES) ×4 IMPLANT
DRAPE STERI POUCH LG 24X46 STR (DRAPES) ×12 IMPLANT
DRAPE UNDER BUTTOCK W/FLU (DRAPES) ×4 IMPLANT
DRSG TEGADERM 2-3/8X2-3/4 SM (GAUZE/BANDAGES/DRESSINGS) ×16 IMPLANT
DRSG TELFA 3X8 NADH (GAUZE/BANDAGES/DRESSINGS) ×4 IMPLANT
ELECT BLADE 6 FLAT ULTRCLN (ELECTRODE) IMPLANT
ELECT CAUTERY BLADE 6.4 (BLADE) IMPLANT
ELECT REM PT RETURN 9FT ADLT (ELECTROSURGICAL) ×4
ELECTRODE REM PT RTRN 9FT ADLT (ELECTROSURGICAL) ×2 IMPLANT
GAUZE SPONGE 4X4 12PLY STRL (GAUZE/BANDAGES/DRESSINGS) IMPLANT
GAUZE SPONGE NON-WVN 2X2 STRL (MISCELLANEOUS) ×4 IMPLANT
GLOVE BIO SURGEON STRL SZ8 (GLOVE) ×16 IMPLANT
GLOVE INDICATOR 8.0 STRL GRN (GLOVE) ×24 IMPLANT
GOWN STRL REUS W/ TWL LRG LVL3 (GOWN DISPOSABLE) ×4 IMPLANT
GOWN STRL REUS W/ TWL XL LVL3 (GOWN DISPOSABLE) ×2 IMPLANT
GOWN STRL REUS W/TWL LRG LVL3 (GOWN DISPOSABLE) ×4
GOWN STRL REUS W/TWL XL LVL3 (GOWN DISPOSABLE) ×2
GRASPER ENDO ROTC 5X36 (INSTRUMENTS) ×4 IMPLANT
GRASPER SUT TROCAR 14GX15 (MISCELLANEOUS) ×4 IMPLANT
IRRIGATION STRYKERFLOW (MISCELLANEOUS) IMPLANT
IRRIGATOR STRYKERFLOW (MISCELLANEOUS)
IV LACTATED RINGERS 1000ML (IV SOLUTION) ×4 IMPLANT
KIT PINK PAD W/HEAD ARE REST (MISCELLANEOUS) ×4
KIT PINK PAD W/HEAD ARM REST (MISCELLANEOUS) ×2 IMPLANT
KIT RM TURNOVER CYSTO AR (KITS) ×4 IMPLANT
LABEL OR SOLS (LABEL) ×4 IMPLANT
LIGASURE BLUNT 5MM 37CM (INSTRUMENTS) ×8 IMPLANT
LIQUID BAND (GAUZE/BANDAGES/DRESSINGS) ×4 IMPLANT
MANIPULATOR VCARE LG CRV RETR (MISCELLANEOUS) ×4 IMPLANT
MANIPULATOR VCARE STD CRV RETR (MISCELLANEOUS) IMPLANT
NDL INSUFF 14G 150MM VS150000 (NEEDLE) ×4 IMPLANT
NDL INSUFF ACCESS 14 VERSASTEP (NEEDLE) ×4 IMPLANT
NEEDLE SPNL 22GX5 LNG QUINC BK (NEEDLE) IMPLANT
NS IRRIG 1000ML POUR BTL (IV SOLUTION) ×4 IMPLANT
NS IRRIG 500ML POUR BTL (IV SOLUTION) ×4 IMPLANT
OCCLUDER COLPOPNEUMO (BALLOONS) ×4 IMPLANT
PACK GYN LAPAROSCOPIC (MISCELLANEOUS) ×4 IMPLANT
PAD OB MATERNITY 4.3X12.25 (PERSONAL CARE ITEMS) ×4 IMPLANT
PAD PREP 24X41 OB/GYN DISP (PERSONAL CARE ITEMS) ×4 IMPLANT
POUCH ENDO CATCH II 15MM (MISCELLANEOUS) IMPLANT
SET CYSTO W/LG BORE CLAMP LF (SET/KITS/TRAYS/PACK) ×4 IMPLANT
SHEARS ENDO 5MM 31CM (CUTTER) ×4 IMPLANT
SPOGE SURGIFLO 8M (HEMOSTASIS)
SPONGE LAP 4X18 5PK (MISCELLANEOUS) IMPLANT
SPONGE SURGIFLO 8M (HEMOSTASIS) IMPLANT
SPONGE VERSALON 2X2 STRL (MISCELLANEOUS) ×4
SUT ENDO VLOC 180-0-8IN (SUTURE) ×8 IMPLANT
SUT MAXON ABS #0 GS21 30IN (SUTURE) IMPLANT
SUT MNCRL 4-0 (SUTURE) ×4
SUT MNCRL 4-0 27XMFL (SUTURE) ×4
SUT PDS AB 1 TP1 96 (SUTURE) IMPLANT
SUT PLAIN 2 0 XLH (SUTURE) IMPLANT
SUT VIC AB 0 CT1 27 (SUTURE)
SUT VIC AB 0 CT1 27XCR 8 STRN (SUTURE) IMPLANT
SUT VIC AB 0 CT1 36 (SUTURE) ×4 IMPLANT
SUT VIC AB 2-0 UR6 27 (SUTURE) IMPLANT
SUT VICRYL 0 AB UR-6 (SUTURE) ×8 IMPLANT
SUT VICRYL AB 3-0 FS1 BRD 27IN (SUTURE) IMPLANT
SUTURE MNCRL 4-0 27XMF (SUTURE) ×4 IMPLANT
SYR 3ML LL SCALE MARK (SYRINGE) IMPLANT
SYR 50ML LL SCALE MARK (SYRINGE) ×8 IMPLANT
SYRINGE 10CC LL (SYRINGE) ×4 IMPLANT
TROCAR BLUNT TIP 12MM OMST12BT (TROCAR) ×4 IMPLANT
TROCAR VERSASTEP PLUS 12MM (TROCAR) ×4 IMPLANT
TROCAR VERSASTEP PLUS 5MM (TROCAR) ×4 IMPLANT
TUBING INSUFFLATOR HEATED (MISCELLANEOUS) ×4 IMPLANT

## 2015-08-28 NOTE — Anesthesia Preprocedure Evaluation (Signed)
Anesthesia Evaluation  Patient identified by MRN, date of birth, ID band Patient awake    Reviewed: Allergy & Precautions, H&P , NPO status , Patient's Chart, lab work & pertinent test results  History of Anesthesia Complications (+) history of anesthetic complications  Airway Mallampati: III  TM Distance: >3 FB Neck ROM: full    Dental  (+) Poor Dentition, Chipped   Pulmonary neg pulmonary ROS, neg shortness of breath,    Pulmonary exam normal breath sounds clear to auscultation       Cardiovascular Exercise Tolerance: Good (-) angina(-) Past MI and (-) DOE negative cardio ROS Normal cardiovascular exam Rhythm:regular Rate:Normal     Neuro/Psych negative neurological ROS  negative psych ROS   GI/Hepatic negative GI ROS, Neg liver ROS, neg GERD  ,  Endo/Other  negative endocrine ROS  Renal/GU negative Renal ROS  negative genitourinary   Musculoskeletal   Abdominal   Peds  Hematology negative hematology ROS (+)   Anesthesia Other Findings High spinal with C section  Past Medical History: No date: Complication of anesthesia     Comment:  with c-section/spinal anesthesia No date: Thyroid disease  Past Surgical History: No date: CESAREAN SECTION 06/17/2012: CHOLECYSTECTOMY N/A     Comment: Procedure: LAPAROSCOPIC CHOLECYSTECTOMY WITH               INTRAOPERATIVE CHOLANGIOGRAM;  Surgeon: Axel FillerArmando              Ramirez, MD;  Location: [facility] OR;  Service:               General;  Laterality: N/A;  BMI    Body Mass Index:  28.90 kg/m      Reproductive/Obstetrics negative OB ROS                             Anesthesia Physical Anesthesia Plan  ASA: III  Anesthesia Plan: General ETT   Post-op Pain Management:    Induction:   Airway Management Planned:   Additional Equipment:   Intra-op Plan:   Post-operative Plan:   Informed Consent: I have reviewed the patients History and  Physical, chart, labs and discussed the procedure including the risks, benefits and alternatives for the proposed anesthesia with the patient or authorized representative who has indicated his/her understanding and acceptance.   Dental Advisory Given  Plan Discussed with: Anesthesiologist, CRNA and Surgeon  Anesthesia Plan Comments:         Anesthesia Quick Evaluation

## 2015-08-28 NOTE — Op Note (Signed)
Operative Note   08/28/2015 9:42 AM  PRE-OP DIAGNOSIS: Menorrhagia/possible adenomyosis, pelvic pain    POST-OP DIAGNOSIS: same and large cervical Nabothian cysts  SURGEON: Surgeon(s) and Role:    * Leida Lauth, MD - Primary  ASSISTANT:    * Conard Novak, MD - Assisting   ANESTHESIA: General ET   PROCEDURE: HYSTERECTOMY TOTAL LAPAROSCOPIC WITH BILATERAL SALPINGECTOMY, CYSTOSCOPY   ESTIMATED BLOOD LOSS: less than 50 mL  DRAINS: none  TOTAL IV FLUIDS: per Anesthesia  SPECIMENS: uterus and fallopian tubes  COMPLICATIONS: none  DISPOSITION: PACU - hemodynamically stable.  CONDITION: stable  INDICATIONS: Menorrhagia and pelvic pain with normal endometrial and endocervical biopsy in clinic.   OPERATIVE FINDINGS: Normal uterus and adnexa, with large Nabothian cyst on right side of cervix going into the broad ligament.  There were some omental adhesions under the old incision.  PROCEDURE IN DETAIL: After informed consent was obtained, the patient was taken to the operating room where anesthesia was obtained without difficulty. The patient was positioned in the dorsal lithotomy position in Corinne stirrups and her arms were carefully tucked at her sides and the usual precautions were taken.  She was prepped and draped in normal sterile fashion.  Time-out was performed and a Foley catheter was placed into the bladder and a large VCare uterine manipulator was placed in the uterus without incident.    An open Hasson technique was used to place an infraumbilical 10-mm baloon trocar under direct visualization. The laparoscope was introduced and CO2 gas was infused for pneumoperitoneum to a pressure of 15 mm Hg.  Right and left lateral 5-mm ports and a 5-12 mm suprapubic port were placed under direct visualization of the laparoscope using an EndoStep technique.   The patient was placed in Trendelenburg and the bowel was displaced up into the upper abdomen.  Round ligaments were  divided on each side with the Ligasure and the retroperitoneal space was opened bilaterally.  The ureters were identified and preserved.  The fallopian tubes were seperated from the ovaries with the Ligasure up to the uterus,  and the utero ovarian ligaments were transected with the LigaSure device.  A bladder flap was created with some difficulty due to prior surgery and large Nabothian cyst on the right. The bladder was dissected down off the lower uterine segment and cervix using endoshears and electrocautery.  The uterine arteries were skeletonized bilaterally, sealed and divided with the LigaSure device.  A colpotomy was performed posteriorly due to the Nabothian cyst obscuring the Vcare cup anteriorly.  The incision was continued  circumferentially along the V-Care ring with electrocautery and the cervix was incised from the vagina and the specimen was removed through the vagina.  This required a lower dissection on the right to get below the Nabothian cyst and there was some bleeding from the uterine artery that was controlled with the LigaSure. A pneumo balloon was placed in the vagina and the vaginal cuff was then closed in a running continuous fashion using the EndoStitch technique with 0 V-Lock suture with careful attention to include the vaginal cuff angles and the vaginal mucosa within the closure.    Cystoscopy was performed due to the lower dissection on the right using fluorescein and good urine jets were seen bilaterally. The bladder mucosa was intact.  Hemostasis was observed. The intraperitoneal pressure was dropped, and all planes of dissection, vascular pedicles and the vaginal cuff were found to be hemostatic.  The suprapubic trocar was removed and the fascia was  closed with 0 Vicryl suture using the Endoclose technique. The lateral trocars were removed under visualization.   Before the umbilical trocar was removed the CO2 gas was released.  The fascia there was closed with 0 Vicryl suture  in interrupted technique.   The skin incisions were closed with subcuticular stitches and then glue.  The patient tolerated the procedure well.  Sponge, lap and needle counts were correct x2.  The patient was taken to recovery room in excellent condition.  Antibiotics:  2 gms of Ancef in advance of incision and discontinued after surgery.   VTE prophylaxis: was ordered perioperatively with IPC boots.    Leida LauthAndrew Adea Geisel, MD

## 2015-08-28 NOTE — Transfer of Care (Signed)
Immediate Anesthesia Transfer of Care Note  Patient: Julia Espinoza  Procedure(s) Performed: Procedure(s): HYSTERECTOMY TOTAL LAPAROSCOPIC WITH BILATERAL SALPINGECTOMY (N/A) PELVIC/AORTIC LYMPH NODE RESECTION (N/A) CYSTOSCOPY  Patient Location: PACU  Anesthesia Type:General  Level of Consciousness: awake, alert , oriented and patient cooperative  Airway & Oxygen Therapy: Patient Spontanous Breathing and Patient connected to face mask oxygen  Post-op Assessment: Report given to RN, Post -op Vital signs reviewed and stable and Patient moving all extremities X 4  Post vital signs: Reviewed and stable  Last Vitals:  Vitals:   08/28/15 0618 08/28/15 0957  BP: 107/71 122/70  Pulse: (!) 59 90  Resp: 16 13  Temp: 36.8 C 36.6 C    Last Pain:  Vitals:   08/28/15 0618  TempSrc: Tympanic         Complications: No apparent anesthesia complications

## 2015-08-28 NOTE — H&P (View-Only) (Signed)
Gynecologic Oncology Consult Visit   Referring Provider:  [facility], [place], [place]  Chief Concern: menorrhagia, pelvic pain  Subjective:  Julia Espinoza is a 45 y.o. female G9P8 who is seen in consultation for pelvic pain and abnormal pelvic US.  Patient returns and continues to have pelvic pain and heavy menses lasting a long time. Endometrial and endocervical biopsies negative three weeks ago in clinic. She continues to have bleeding and pain and wants a hysterectomy.  History Patient has had pelvic pain for five years that is worse with menses and sometimes worse with urination.  [facility].    Generally normal periods lasting 3-4 days.  Missed period in May and then the last one two weeks ago after pelvic US heavier than usual and lasting 6 days. No bleeding in between periods.   She had LS choly 2015 and C section and BTL in past.  Also has some dizziness and takes ibuprofen and meclizine.  Subclinical hypothyroidism.  Mother had some type of gyn cancer at a young age.    06/27/15 Ultrasound FINDINGS: UterusMeasurements: 10.6 x 4.4 x 5.5 cm . Septated cystic structure within the cervix measures 3.6 x 2.9 x 3.3 cm. Previously 2.5 x 1.6 x 2.8 cm.  Endometrium: Thickness: 2 cm. Complex echogenic structure within the endometrial canal measures 1.6 x 1.2 x 2.7 cm.  Right ovary: Measurements: Not visualize. No adnexal mass.  Left ovary: Measurements: 3.4 x 1.4 x 3.3 cm. There is a cyst within the left ovary measuring 2.9 x 1.3 x 2.5 cm.  Other findings: No abnormal free fluid.  IMPRESSION: 1. A focal endometrial lesion is suspected. Consider sonohysterogram for further evaluation, prior to hysteroscopy or endometrial biopsy. 2. Cystic structure in the region of the cervix likely reflects a complex nabothian cyst is increased in volume from previous exam. 3. Left ovary cyst.  7/16 US Uterus: Measurements: 9.8 x 3.7 x  6.0 cm. Nabothian cysts. No fibroids are other significant uterine lesions.  Endometrium: Thickness: 3.4 mm. No focal abnormality visualized.  Right ovary: Measurements: 2.9 x 1.9 x 1.6 cm. Normal appearance/no adnexal mass.  Left ovary: Measurements: 3.1 x 1.3 x 1.8 cm. Normal appearance/no adnexal mass.  Other findings: No free fluid.  IMPRESSION: No significant abnormality.  Problem List: Patient Active Problem List   Diagnosis Date Noted  . Pain in joint, pelvic region and thigh 07/10/2015  . Dysmenorrhea 07/10/2015  . Abnormal uterine bleeding 07/10/2015  . Symptomatic cholelithiasis 06/17/2012  . Transaminitis 06/17/2012    Past Medical History: Past Medical History  Diagnosis Date  . Thyroid disease     Past Surgical History: Past Surgical History  Procedure Laterality Date  . Cholecystectomy N/A 06/17/2012    Procedure: LAPAROSCOPIC CHOLECYSTECTOMY WITH INTRAOPERATIVE CHOLANGIOGRAM;  Surgeon: Axel FillerArmando Ramirez, MD;  Location: [facility] OR;  Service: General;  Laterality: N/A;  . Cesarean section      Family History: No family history on file.  Social History: Social History   Social History  . Marital Status: Single    Spouse Name: N/A  . Number of Children: N/A  . Years of Education: N/A   Occupational History  . Not on file.   Social History Main Topics  . Smoking status: Never Smoker   . Smokeless tobacco: Never Used  . Alcohol Use: No  . Drug Use: No  . Sexual Activity: Not on file   Other Topics Concern  . Not on file  Social History Narrative    Allergies: No Known Allergies  Current Medications: Current Outpatient Prescriptions  Medication Sig Dispense Refill  . ibuprofen (ADVIL,MOTRIN) 800 MG tablet Take 1 tablet (800 mg total) by mouth 3 (three) times daily. 21 tablet 0  . acetaminophen (TYLENOL) 500 MG tablet Take 500 mg by mouth every 6 (six) hours as needed (pain). Reported on 07/31/2015    . methocarbamol (ROBAXIN) 500 MG tablet  Take 1 tablet (500 mg total) by mouth 2 (two) times daily. (Patient not taking: Reported on 07/10/2015) 10 tablet 0  . naproxen (NAPROSYN) 500 MG tablet Take 1 tablet (500 mg total) by mouth 2 (two) times daily. (Patient not taking: Reported on 07/10/2015) 30 tablet 0  . OVER THE COUNTER MEDICATION Take by mouth daily. Reported on 07/31/2015    . oxyCODONE-acetaminophen (PERCOCET/ROXICET) 5-325 MG per tablet Take 1-2 tablets by mouth every 4 (four) hours as needed. (Patient not taking: Reported on 07/21/2014) 30 tablet 0   No current facility-administered medications for this visit.    Review of Systems General: negative for, fevers, chills, fatigue, changes in sleep, changes in weight or appetite Skin: negative for changes in color, texture, moles or lesions Eyes: negative for, changes in vision, pain, diplopia HEENT: negative for, change in hearing, pain, discharge, tinnitus, vertigo, voice changes, sore throat, neck masses Breasts: negative for breast lumps Pulmonary: negative for, dyspnea, orthopnea, productive cough Cardiac: negative for, palpitations, syncope, pain, discomfort, pressure Gastrointestinal: negative for, dysphagia, nausea, vomiting, jaundice, pain, constipation, diarrhea, hematemesis, hematochezia Genitourinary/Sexual: negative for, dysuria, discharge, hesitancy, nocturia, retention, stones, infections, STD's, incontinence Ob/Gyn: no hot flashes Musculoskeletal: negative for, pain, stiffness, swelling, range of motion limitation Hematology: No coagulation disorder, negative for, easy bruising, bleeding Neurologic/Psych: negative for, headaches, seizures, paralysis, weakness, tremor, change in gait, change in sensation, mood swings, depression, anxiety, change in memory  Objective:  Physical Examination:  BP 107/65 mmHg  Pulse 66  Temp(Src) 97 F (36.1 C) (Tympanic)  Ht  (1.575 m)  Wt 158 lb 1.1 oz (71.7 kg)  BMI 28.90 kg/m2   ECOG Performance Status: 1 -  Symptomatic but completely ambulatory  General appearance: alert, cooperative and appears stated age HEENT:PERRLA, neck supple with midline trachea and thyroid without masses Lymph node survey: non-palpable, axillary, inguinal, supraclavicular Cardiovascular: regular rate and rhythm, no murmurs or gallops Respiratory: normal air entry, lungs clear to auscultation and no rales, rhonchi or wheezing Breast exam: not examined. Abdomen: soft, non-tender, without masses or organomegaly, no hernias and well healed incision from LS Back: inspection of back is normal Extremities: extremities normal, atraumatic, no cyanosis or edema Skin exam - normal coloration and turgor, no rashes, no suspicious skin lesions noted. Neurological exam reveals alert, oriented, normal speech, no focal findings or movement disorder noted.  Pelvic: exam chaperoned by nurse;  Vulva: normal appearing vulva with no masses, tenderness or lesions; Vagina: normal vagina; Adnexa: normal adnexa in size, nontender and no masses; Uterus: uterus is upper limit of normal size, nontender; Cervix: anteverted; Rectal: normal rectal, no masses  Assessment:  Brit Stephannie Peters is a 70 y.o. P56 female diagnosed with chronic pelvic pain and concern for endometrial lesion on recent pelvic ultrasound.  She had missed a period before the ultrasound and then had a heavier one right after that.  Her history is concerning for adenomyosis and/or endometriosis and it was surprising that her uterine cavity sounded to 10 cm. ECC and endometrial biopsy negative three weeks ago.  Plan:   Problem  List Items Addressed This Visit      Genitourinary   Dysmenorrhea   Abnormal uterine bleeding - Primary     She thought her last PAP was 12/16, but it was in 2014 (normal).  So we did a PAP today.  I think she will likely benefit from a hysterectomy, and suspect she has adenomyosis.  We will schedule her for TLH, BS on 08/28/15 and Dr Ranae Plumberhelsea Ward will be  the cosurgeon.  She understands the possibility of conversion to laparotomy and that if frozen section shows cancer, which is unlikely, we would do pelvic and possibly aortic node sampling.    The risks of surgery were discussed in detail with the translator and she understands these to include infection; wound separation; hernia; vaginal cuff separation, injury to adjacent organs such as bowel, bladder, blood vessels, ureters and nerves; bleeding which may require blood transfusion; anesthesia risk; thromboembolic events; possible death; unforeseen complications; possible need for re-exploration; medical complications such as heart attack, stroke, pleural effusion and pneumonia; and, if staging performed the risk of lymphedema and lymphocyst.  The patient will receive DVT and antibiotic prophylaxis as indicated.  She voiced a clear understanding.  She had the opportunity to ask questions and written informed consent was obtained today.  The patient's diagnosis, an outline of the further diagnostic and laboratory studies which will be required, the recommendation, and alternatives were discussed with an interpreter.  All questions were answered to the patient's satisfaction.  Leida LauthAndrew Peighton Mehra, MD

## 2015-08-28 NOTE — Discharge Instructions (Signed)
Anestesia general - Adultos °(General Anesthesia, Adult) °La anestesia general produce un estado similar al sueño en el que no hay sensibilidad, por medio de la administración de medicamentos (anestésicos). Impide estar alerta y sentir dolor durante un procedimiento médico. El médico indicará anestesia general si el procedimiento:  °· Va a durar mucho tiempo. °· Es doloroso o molesto. °· Sería atemorizante para ver o escuchar. °· Requiere que permanezca quieto. °· Afecta la respiración. °· Puede causar una pérdida significativa de sangre. °INFORME A SU MÉDICO SOBRE:  °· Alergias a alimentos o medicamentos. °· Medicamentos que utiliza, incluyendo vitaminas, hierbas, gotas oftálmicas, medicamentos de venta libre y cremas. °· Uso de corticoides (por vía oral o cremas). °· Problemas anteriores con anestésicos o medicamentos para adormecer, incluyendo los problemas experimentados por familiares. °· Antecedentes de hemorragias o coágulos sanguíneos. °· Cirugías previas y tipos de anestésicos recibidos. °· Posibilidad de embarazo, si corresponde. °· Consumo de cigarrillos, alcohol o drogas. °· Otros problemas de salud, especialmente si sufre diabetes, apnea del sueño o hipertensión arterial. °RIESGOS Y COMPLICACIONES  °La anestesia general rara vez causa complicaciones. Sin embargo, si hay complicaciones, pueden poner en peligro la vida. Las complicaciones son:  °· Infección pulmonar. °· Ictus. °· Ataque cardíaco. °· Despertar durante el procedimiento. Cuando esto ocurre, el paciente puede ser incapaz de moverse y comunicar que está despierto. Podrá sentir dolor intenso. °Los adultos mayores y los que tengan problemas médicos graves son más propensos a sufrir complicaciones que los adultos jóvenes y sanos. Algunas de las complicaciones se pueden prevenir respondiendo a todas las preguntas del médico y siguiendo todas las instrucciones previas al procedimiento. Es importante que informe a su médico si no ha seguido alguna  de las instrucciones previas al procedimiento, especialmente las relacionadas con la dieta. Cualquier alimento o líquido en el estómago pueden causar problemas cuando se recibe anestesia general.  °ANTES DEL PROCEDIMIENTO  °· Pregúntele a su médico si tendrá que pasar la noche en el hospital. Si no pasará la noche hospitalizado, haga arreglos con un adulto para que lo lleve de vuelta a su casa y se quede con usted durante 24 horas. °· Siga las instrucciones de su médico si usted está tomando suplementos dietarios o medicamentos. El médico le dirá cuándo debe dejar de tomarlos o reducir la dosis. °· No fume durante el mayor tiempo posible antes del procedimiento. Si puede, deje de fumar 3-6 semanas antes. °· No tome nuevos suplementos dietéticos ni medicamentos dentro de 1 semana del procedimiento, excepto que su médico lo apruebe. °· No coma dentro de las 8 horas de su procedimiento o según las indicaciones de su médico. Beba sólo líquidos claros, como agua, café negro (sin leche o crema), y jugos de frutas (sin pulpa). °· No beba dentro de las 3 horas del procedimiento o según las indicaciones de su médico. °· Puede lavarse los dientes en la mañana del procedimiento, pero asegúrese de escupir la pasta de dientes y el agua cuando haya terminado. °PROCEDIMIENTO  °Recibirá la anestesia a través de una máscara, a través de una vía intravenosa (IV) o ambas. Un médico que se especializa en anestesia (anestesiólogo) o una enfermera especializada (enfermera especializada en anestesiología) o ambos, permanecerán con usted durante todo el procedimiento para asegurarse de que está dormido. También le controlarán la presión arterial, el pulso y los niveles de oxígeno para asegurarse de que no tenga ningún problema. Una vez que esté dormido, le colocarán un tubo o una máscara para ayudarlo a respirar.  °DESPUÉS   DEL PROCEDIMIENTO  °Lo despertarán cuando el procedimiento se haya completado. Permanecerá en la sala donde se realizó  el procedimiento o en un área de recuperación. Usted puede tener dolor de garganta si le colocaron un respirador. También puede sentir:  °· Mareos. °· Debilidad. °· Somnolencia. °· Confusión. °· Náuseas. °· Frío. °Estas respuestas son normales y pueden durar hasta 24 horas después del procedimiento. El médico le dirá cuándo estará listo para volver a su casa. En general, será cuando esté completamente despierto y estable.  °  °Esta información no tiene como fin reemplazar el consejo del médico. Asegúrese de hacerle al médico cualquier pregunta que tenga. °  °Document Released: 05/07/2010 Document Revised: 01/26/2014 °Elsevier Interactive Patient Education ©2016 [employer] Inc[employer] ° °

## 2015-08-28 NOTE — Interval H&P Note (Signed)
History and Physical Interval Note:  08/28/2015 7:07 AM  Julia Espinoza  has presented today for surgery, with the diagnosis of heavy bleeding,pelvic pain  The various methods of treatment have been discussed with the patient and family. After consideration of risks, benefits and other options for treatment, the patient has consented to  Procedure(s): HYSTERECTOMY TOTAL LAPAROSCOPIC (N/A) LAPAROTOMY (N/A) PELVIC/AORTIC LYMPH NODE RESECTION (N/A) as a surgical intervention .  The patient's history has been reviewed, patient examined, no change in status, stable for surgery.  I have reviewed the patient's chart and labs.  Questions were answered to the patient's satisfaction.     Leida LauthAndrew Ramiah Helfrich   Patient seen with interpreter and she is prepared for TLH/BS for probable menorrhagia.  Lab work normal.  Dr Jean RosenthalJackson will assist. Leida LauthAndrew Kyian Obst, MD

## 2015-08-28 NOTE — OR Nursing (Addendum)
Discharged instructions discussed with pt. And interpreter.. Pt and sister voice understanding of discharge instructions.

## 2015-08-28 NOTE — Anesthesia Postprocedure Evaluation (Signed)
Anesthesia Post Note  Patient: Julia Espinoza  Procedure(s) Performed: Procedure(s) (LRB): HYSTERECTOMY TOTAL LAPAROSCOPIC WITH BILATERAL SALPINGECTOMY (N/A) PELVIC/AORTIC LYMPH NODE RESECTION (N/A) CYSTOSCOPY  Patient location during evaluation: PACU Anesthesia Type: General Level of consciousness: awake and alert Pain management: pain level controlled Vital Signs Assessment: post-procedure vital signs reviewed and stable Respiratory status: spontaneous breathing, nonlabored ventilation, respiratory function stable and patient connected to nasal cannula oxygen Cardiovascular status: blood pressure returned to baseline and stable Postop Assessment: no signs of nausea or vomiting Anesthetic complications: no    Last Vitals:  Vitals:   08/28/15 1112 08/28/15 1125  BP:  111/61  Pulse: 78 86  Resp:    Temp:  36.6 C    Last Pain:  Vitals:   08/28/15 1125  TempSrc: Oral  PainSc: 6                  Cleda MccreedyJoseph K Shila Kruczek

## 2015-08-28 NOTE — Anesthesia Procedure Notes (Signed)
Procedure Name: Intubation Date/Time: 08/28/2015 7:32 AM Performed by: Michaele OfferSAVAGE, Audric Venn Pre-anesthesia Checklist: Patient identified, Emergency Drugs available, Suction available, Patient being monitored and Timeout performed Patient Re-evaluated:Patient Re-evaluated prior to inductionOxygen Delivery Method: Circle system utilized Preoxygenation: Pre-oxygenation with 100% oxygen Intubation Type: IV induction Ventilation: Mask ventilation without difficulty Laryngoscope Size: Mac and 3 Grade View: Grade I Tube type: Oral Tube size: 7.0 mm Number of attempts: 1 Airway Equipment and Method: Rigid stylet Placement Confirmation: ETT inserted through vocal cords under direct vision,  positive ETCO2 and breath sounds checked- equal and bilateral Secured at: 19 cm Tube secured with: Tape Dental Injury: Teeth and Oropharynx as per pre-operative assessment

## 2015-08-29 LAB — SURGICAL PATHOLOGY

## 2015-08-30 NOTE — Progress Notes (Signed)
  Oncology Nurse Navigator Documentation  Navigator Location: [facility] (08/30/15 0900) Navigator Encounter Type: Follow-up Appt (08/30/15 0900)           Patient Visit Type: Post-op Appt (08/30/15 0900) Treatment Phase: Follow-up (08/30/15 0900) Barriers/Navigation Needs: Coordination of Care (08/30/15 0900)   Interventions: Coordination of Care (08/30/15 0900)   Coordination of Care: Appts (08/30/15 0900)                  Time Spent with Patient: 15 (08/30/15 0900)   Scheduling notified to arrange 4 week post op appt with interpreter

## 2015-09-25 ENCOUNTER — Inpatient Hospital Stay: Payer: Self-pay | Attending: Obstetrics and Gynecology | Admitting: Obstetrics and Gynecology

## 2015-09-25 ENCOUNTER — Encounter: Payer: Self-pay | Admitting: Obstetrics and Gynecology

## 2015-09-25 VITALS — BP 113/69 | HR 79 | Temp 97.0°F | Ht 62.0 in | Wt 153.2 lb

## 2015-09-25 DIAGNOSIS — N939 Abnormal uterine and vaginal bleeding, unspecified: Secondary | ICD-10-CM

## 2015-09-25 DIAGNOSIS — N946 Dysmenorrhea, unspecified: Secondary | ICD-10-CM

## 2015-09-25 NOTE — Progress Notes (Signed)
Patient here for follow up. She has been spotting for the past seven days.

## 2015-09-25 NOTE — Progress Notes (Signed)
  Oncology Nurse Navigator Documentation  Navigator Location: [facility] (09/25/15 1500) Navigator Encounter Type:  (post op) (09/25/15 1500)                                          Time Spent with Patient: 15 (09/25/15 1500)   Chaperoned pelvic exam. Patient does not need to follow up in Gyn Onc clinic.

## 2015-09-25 NOTE — Progress Notes (Signed)
Gynecologic Oncology Consult Visit   Referring Provider:  [facility], [place], [place]  Chief Concern: menorrhagia, pelvic pain  Subjective:  Julia Espinoza is a 45 y.o. female G9P8 who is seen in consultation for pelvic pain and abnormal pelvic US.  Patient returned 7/16 and continued to have pelvic pain and heavy menses lasting a long time. Endometrial and endocervical biopsies negative three weeks before. She continued to have bleeding and pain and wanted a hysterectomy.  08/28/15 underwent TLH/BS and found to have extensive large Nabothian cysts into the broad ligament on the right.  Ovaries were normal and were left in place.  PATH DIAGNOSIS:  A. UTERUS WITH CERVIX AND BILATERAL FALLOPIAN TUBES; HYSTERECTOMY WITH BILATERAL SALPINGECTOMY:  - CERVIX WITH NABOTHIAN CYSTS.  - PROLIFERATIVE ENDOMETRIUM WITH MILD DISORDER.  - BILATERAL FALLOPIAN TUBES WITH VASCULAR CONGESTION.  - NEGATIVE FOR HYPERPLASIA AND CARCINOMA.   Returns for postop check.  Only slight spotting from vagina.   History Patient has had pelvic pain for five years that is worse with menses and sometimes worse with urination.  Normal PAP at [facility] 2014.    Generally normal periods lasting 3-4 days.  Missed period in May and then the last one two weeks ago after pelvic US heavier than usual and lasting 6 days. No bleeding in between periods.   She had LS choly 2015 and C section and BTL in past.  Also has some dizziness and takes ibuprofen and meclizine.  Subclinical hypothyroidism.  Mother had some type of gyn cancer at a young age.    06/27/15 Ultrasound FINDINGS: UterusMeasurements: 10.6 x 4.4 x 5.5 cm . Septated cystic structure within the cervix measures 3.6 x 2.9 x 3.3 cm. Previously 2.5 x 1.6 x 2.8 cm.  Endometrium: Thickness: 2 cm. Complex echogenic structure within the endometrial canal measures 1.6 x 1.2 x 2.7 cm.  Right ovary: Measurements: Not visualize. No  adnexal mass.  Left ovary: Measurements: 3.4 x 1.4 x 3.3 cm. There is a cyst within the left ovary measuring 2.9 x 1.3 x 2.5 cm.  Other findings: No abnormal free fluid.  IMPRESSION: 1. A focal endometrial lesion is suspected. Consider sonohysterogram for further evaluation, prior to hysteroscopy or endometrial biopsy. 2. Cystic structure in the region of the cervix likely reflects a complex nabothian cyst is increased in volume from previous exam. 3. Left ovary cyst.  7/16 US Uterus: Measurements: 9.8 x 3.7 x 6.0 cm. Nabothian cysts. No fibroids are other significant uterine lesions.  Endometrium: Thickness: 3.4 mm. No focal abnormality visualized.  Right ovary: Measurements: 2.9 x 1.9 x 1.6 cm. Normal appearance/no adnexal mass.  Left ovary: Measurements: 3.1 x 1.3 x 1.8 cm. Normal appearance/no adnexal mass.  Other findings: No free fluid.  IMPRESSION: No significant abnormality.  Problem List: Patient Active Problem List   Diagnosis Date Noted  . Pain in joint, pelvic region and thigh 07/10/2015  . Dysmenorrhea 07/10/2015  . Abnormal uterine bleeding 07/10/2015  . Symptomatic cholelithiasis 06/17/2012  . Transaminitis 06/17/2012    Past Medical History: Past Medical History:  Diagnosis Date  . Complication of anesthesia     with c-section/spinal anesthesia  . Thyroid disease     Past Surgical History: Past Surgical History:  Procedure Laterality Date  . CESAREAN SECTION    . CHOLECYSTECTOMY N/A 06/17/2012   Procedure: LAPAROSCOPIC CHOLECYSTECTOMY WITH INTRAOPERATIVE CHOLANGIOGRAM;  Surgeon: Axel Filler, MD;  Location: [facility] OR;  Service: General;  Laterality: N/A;  . CYSTOSCOPY  08/28/2015   Procedure: CYSTOSCOPY;  Surgeon: Leida Lauth, MD;  Location: [facility] ORS;  Service: Gynecology;;  . LAPAROSCOPIC HYSTERECTOMY N/A 08/28/2015   Procedure: HYSTERECTOMY TOTAL LAPAROSCOPIC WITH BILATERAL SALPINGECTOMY;  Surgeon: Leida Lauth, MD;  Location: [facility] ORS;   Service: Gynecology;  Laterality: N/A;  . PELVIC LYMPH NODE DISSECTION N/A 08/28/2015   Procedure: PELVIC/AORTIC LYMPH NODE RESECTION;  Surgeon: Leida Lauth, MD;  Location: [facility] ORS;  Service: Gynecology;  Laterality: N/A;    Family History: Family History  Problem Relation Age of Onset  . Cancer Mother     Social History: Social History   Social History  . Marital status: Married    Spouse name: N/A  . Number of children: N/A  . Years of education: N/A   Occupational History  . Not on file.   Social History Main Topics  . Smoking status: Never Smoker  . Smokeless tobacco: Never Used  . Alcohol use No  . Drug use: No  . Sexual activity: Not on file   Other Topics Concern  . Not on file   Social History Narrative  . No narrative on file    Allergies: No Known Allergies  Current Medications: Current Outpatient Prescriptions  Medication Sig Dispense Refill  . ibuprofen (ADVIL,MOTRIN) 600 MG tablet Take 1 tablet (600 mg total) by mouth every 6 (six) hours as needed for mild pain or cramping. 30 tablet 0   No current facility-administered medications for this visit.     Review of Systems General: negative for, fevers, chills, fatigue, changes in sleep, changes in weight or appetite Skin: negative for changes in color, texture, moles or lesions Eyes: negative for, changes in vision, pain, diplopia HEENT: negative for, change in hearing, pain, discharge, tinnitus, vertigo, voice changes, sore throat, neck masses Breasts: negative for breast lumps Pulmonary: negative for, dyspnea, orthopnea, productive cough Cardiac: negative for, palpitations, syncope, pain, discomfort, pressure Gastrointestinal: negative for, dysphagia, nausea, vomiting, jaundice, pain, constipation, diarrhea, hematemesis, hematochezia Genitourinary/Sexual: negative for, dysuria, discharge, hesitancy, nocturia, retention, stones, infections, STD's, incontinence Ob/Gyn: no hot  flashes Musculoskeletal: negative for, pain, stiffness, swelling, range of motion limitation Hematology: No coagulation disorder, negative for, easy bruising, bleeding Neurologic/Psych: negative for, headaches, seizures, paralysis, weakness, tremor, change in gait, change in sensation, mood swings, depression, anxiety, change in memory  Objective:  Physical Examination:  BP 113/69 (BP Location: Left Arm, Patient Position: Sitting)   Pulse 79   Temp 97 F (36.1 C) (Tympanic)   Ht 5\' 2"  (1.575 m)   Wt 153 lb 3.5 oz (69.5 kg)   BMI 28.02 kg/m    ECOG Performance Status: 0  General appearance: alert, cooperative and appears stated age HEENT:PERRLA, neck supple with midline trachea and thyroid without masses Lymph node survey: non-palpable, axillary, inguinal, supraclavicular Cardiovascular: regular rate and rhythm, no murmurs or gallops Respiratory: normal air entry, lungs clear to auscultation and no rales, rhonchi or wheezing Breast exam: not examined. Abdomen: soft, non-tender, without masses or organomegaly, no hernias and well healed incision from LS Back: inspection of back is normal Extremities: extremities normal, atraumatic, no cyanosis or edema Skin exam - normal coloration and turgor, no rashes, no suspicious skin lesions noted. Neurological exam reveals alert, oriented, normal speech, no focal findings or movement disorder noted.  Pelvic: exam chaperoned by nurse;  Vulva: normal appearing vulva with no masses, tenderness or lesions; Vagina: normal vagina, cuff healing well; Bimanual: nontender and no masses; Rectal: normal rectal, no masses  Assessment:  Julia Espinoza is a  45 y.o. 73P8 female with chronic pelvic pain and concern for endometrial lesion on pelvic ultrasound.  Normal post op exam after TLH, BS four weeks ago.  Ovaries normal and left in place.  Plan:   Problem List Items Addressed This Visit      Genitourinary   Dysmenorrhea   Abnormal uterine  bleeding - Primary    Other Visit Diagnoses   None.    Patient asked not to put anything in the vagina for another 3 weeks.  [facility]  We can see her back if the need arises.     Leida LauthAndrew Malcolm Hetz, MD

## 2016-05-25 ENCOUNTER — Other Ambulatory Visit: Payer: Self-pay

## 2016-05-25 ENCOUNTER — Encounter (HOSPITAL_COMMUNITY): Payer: Self-pay | Admitting: Vascular Surgery

## 2016-05-25 DIAGNOSIS — G43109 Migraine with aura, not intractable, without status migrainosus: Secondary | ICD-10-CM | POA: Insufficient documentation

## 2016-05-25 DIAGNOSIS — R0789 Other chest pain: Secondary | ICD-10-CM | POA: Insufficient documentation

## 2016-05-25 DIAGNOSIS — Z79899 Other long term (current) drug therapy: Secondary | ICD-10-CM | POA: Insufficient documentation

## 2016-05-25 LAB — URINALYSIS, ROUTINE W REFLEX MICROSCOPIC
BACTERIA UA: NONE SEEN
Bilirubin Urine: NEGATIVE
Glucose, UA: NEGATIVE mg/dL
KETONES UR: NEGATIVE mg/dL
LEUKOCYTES UA: NEGATIVE
Nitrite: NEGATIVE
PROTEIN: NEGATIVE mg/dL
Specific Gravity, Urine: 1.009 (ref 1.005–1.030)
pH: 5 (ref 5.0–8.0)

## 2016-05-25 LAB — I-STAT TROPONIN, ED: TROPONIN I, POC: 0 ng/mL (ref 0.00–0.08)

## 2016-05-25 LAB — COMPREHENSIVE METABOLIC PANEL
ALBUMIN: 4 g/dL (ref 3.5–5.0)
ALT: 104 U/L — ABNORMAL HIGH (ref 14–54)
ANION GAP: 10 (ref 5–15)
AST: 80 U/L — ABNORMAL HIGH (ref 15–41)
Alkaline Phosphatase: 82 U/L (ref 38–126)
BILIRUBIN TOTAL: 1 mg/dL (ref 0.3–1.2)
BUN: 13 mg/dL (ref 6–20)
CO2: 23 mmol/L (ref 22–32)
Calcium: 9.1 mg/dL (ref 8.9–10.3)
Chloride: 104 mmol/L (ref 101–111)
Creatinine, Ser: 0.8 mg/dL (ref 0.44–1.00)
GFR calc Af Amer: >60 mL/min (ref >60)
GLUCOSE: 98 mg/dL (ref 65–99)
POTASSIUM: 4.4 mmol/L (ref 3.5–5.1)
Sodium: 137 mmol/L (ref 135–145)
Total Protein: 7.4 g/dL (ref 6.5–8.1)

## 2016-05-25 LAB — DIFFERENTIAL
BASOS PCT: 1 %
Basophils Absolute: 0 10*3/uL (ref 0.0–0.1)
EOS ABS: 0.4 10*3/uL (ref 0.0–0.7)
EOS PCT: 6 %
LYMPHS ABS: 2.3 10*3/uL (ref 0.7–4.0)
Lymphocytes Relative: 37 %
Monocytes Absolute: 0.4 10*3/uL (ref 0.1–1.0)
Monocytes Relative: 7 %
NEUTROS PCT: 49 %
Neutro Abs: 3.1 10*3/uL (ref 1.7–7.7)

## 2016-05-25 LAB — ETHANOL: Alcohol, Ethyl (B): <5 mg/dL (ref <5)

## 2016-05-25 LAB — RAPID URINE DRUG SCREEN, HOSP PERFORMED
Amphetamines: NOT DETECTED
Barbiturates: NOT DETECTED
Benzodiazepines: NOT DETECTED
COCAINE: NOT DETECTED
OPIATES: NOT DETECTED
Tetrahydrocannabinol: NOT DETECTED

## 2016-05-25 LAB — I-STAT CHEM 8, ED
BUN: 17 mg/dL (ref 6–20)
CHLORIDE: 104 mmol/L (ref 101–111)
Calcium, Ion: 1.08 mmol/L — ABNORMAL LOW (ref 1.15–1.40)
Creatinine, Ser: 0.8 mg/dL (ref 0.44–1.00)
Glucose, Bld: 95 mg/dL (ref 65–99)
HCT: 42 % (ref 36.0–46.0)
HEMOGLOBIN: 14.3 g/dL (ref 12.0–15.0)
POTASSIUM: 4.1 mmol/L (ref 3.5–5.1)
Sodium: 139 mmol/L (ref 135–145)
TCO2: 28 mmol/L (ref 0–100)

## 2016-05-25 LAB — PROTIME-INR
INR: 0.99
Prothrombin Time: 13.1 seconds (ref 11.4–15.2)

## 2016-05-25 LAB — CBC
HCT: 43.3 % (ref 36.0–46.0)
HEMOGLOBIN: 14.5 g/dL (ref 12.0–15.0)
MCH: 29.2 pg (ref 26.0–34.0)
MCHC: 33.5 g/dL (ref 30.0–36.0)
MCV: 87.3 fL (ref 78.0–100.0)
Platelets: 231 10*3/uL (ref 150–400)
RBC: 4.96 MIL/uL (ref 3.87–5.11)
RDW: 13.6 % (ref 11.5–15.5)
WBC: 6.2 10*3/uL (ref 4.0–10.5)

## 2016-05-25 LAB — APTT: APTT: 34 s (ref 24–36)

## 2016-05-25 NOTE — ED Notes (Signed)
Name called for room - pt in restroom

## 2016-05-25 NOTE — ED Triage Notes (Signed)
Pt reports to the ED for eval of left sided numbness, weakness, and pain to the left face, arm, and leg. Onset of symptoms was yesterday at approx 16:00 yesterday. She also reports a HA to her left head and neck. Denies aphasia, vision changes, or dysphagia. No arm drift noted, grips are equal, and no facial asymmetry noted at this time.

## 2016-05-26 ENCOUNTER — Emergency Department (HOSPITAL_COMMUNITY)
Admission: EM | Admit: 2016-05-26 | Discharge: 2016-05-26 | Disposition: A | Discharge status: Home / Self Care | Payer: Self-pay | Attending: Emergency Medicine | Admitting: Emergency Medicine

## 2016-05-26 ENCOUNTER — Emergency Department (HOSPITAL_COMMUNITY): Payer: Self-pay

## 2016-05-26 DIAGNOSIS — G43109 Migraine with aura, not intractable, without status migrainosus: Secondary | ICD-10-CM

## 2016-05-26 LAB — I-STAT BETA HCG BLOOD, ED (MC, WL, AP ONLY)

## 2016-05-26 MED ORDER — KETOROLAC TROMETHAMINE 30 MG/ML IJ SOLN
30.0000 mg | Freq: Once | INTRAMUSCULAR | Status: AC
  Administered 2016-05-26: 30 mg via INTRAVENOUS
  Filled 2016-05-26: qty 1

## 2016-05-26 MED ORDER — SODIUM CHLORIDE 0.9 % IV BOLUS (SEPSIS)
1000.0000 mL | Freq: Once | INTRAVENOUS | Status: AC
  Administered 2016-05-26: 1000 mL via INTRAVENOUS

## 2016-05-26 MED ORDER — DIPHENHYDRAMINE HCL 50 MG/ML IJ SOLN
25.0000 mg | Freq: Once | INTRAMUSCULAR | Status: AC
  Administered 2016-05-26: 25 mg via INTRAVENOUS
  Filled 2016-05-26: qty 1

## 2016-05-26 MED ORDER — METOCLOPRAMIDE HCL 5 MG/ML IJ SOLN
10.0000 mg | Freq: Once | INTRAMUSCULAR | Status: AC
  Administered 2016-05-26: 10 mg via INTRAVENOUS
  Filled 2016-05-26: qty 2

## 2016-05-26 NOTE — ED Notes (Signed)
Pt given ice.  

## 2016-05-26 NOTE — ED Provider Notes (Signed)
[facility]-EMERGENCY DEPT Provider Note   CSN: [id number] Arrival date & time: 05/25/16  1759 By signing my name below, I, Levon HedgerElizabeth Hall, attest that this documentation has been prepared under the direction and in the presence of Letasha Kershaw, Jeannett SeniorStephen, MD . Electronically Signed: Levon HedgerElizabeth Hall, Scribe. 05/26/2016. 1:16 AM.   History   Chief Complaint Chief Complaint  Patient presents with  . Extremity Weakness   HPI Julia Espinoza is a 46 y.o. female who presents to the Emergency Department complaining of gradual onset, gradually worsening, intermittent headache onset at 4 pm on 05/24/16. She also reports LLE and LUE numbness and weakness, photophobia, phonophobia, mild left-sided chest wall pain, and intermittent blurred vision. Pt reports that her headache began before onset of weakness or numbness. She has taken Tylenol with no relief of symptoms. Pt has never experienced these symptoms before. She denies any hx of migraines. The patient is currently on no regular medications. Pt denies any speech difficulties or any other pain.   The history is provided by the patient. A language interpreter was used (daughter).   Past Medical History:  Diagnosis Date  . Complication of anesthesia     with c-section/spinal anesthesia  . Thyroid disease    Patient Active Problem List   Diagnosis Date Noted  . Pain in joint, pelvic region and thigh 07/10/2015  . Dysmenorrhea 07/10/2015  . Abnormal uterine bleeding 07/10/2015  . Symptomatic cholelithiasis 06/17/2012  . Transaminitis 06/17/2012   Past Surgical History:  Procedure Laterality Date  . CESAREAN SECTION    . CHOLECYSTECTOMY N/A 06/17/2012   Procedure: LAPAROSCOPIC CHOLECYSTECTOMY WITH INTRAOPERATIVE CHOLANGIOGRAM;  Surgeon: Axel FillerArmando Ramirez, MD;  Location: [facility] OR;  Service: General;  Laterality: N/A;  . CYSTOSCOPY  08/28/2015   Procedure: CYSTOSCOPY;  Surgeon: Leida LauthAndrew Berchuck, MD;  Location: [facility] ORS;  Service: Gynecology;;  . LAPAROSCOPIC  HYSTERECTOMY N/A 08/28/2015   Procedure: HYSTERECTOMY TOTAL LAPAROSCOPIC WITH BILATERAL SALPINGECTOMY;  Surgeon: Leida LauthAndrew Berchuck, MD;  Location: [facility] ORS;  Service: Gynecology;  Laterality: N/A;  . PELVIC LYMPH NODE DISSECTION N/A 08/28/2015   Procedure: PELVIC/AORTIC LYMPH NODE RESECTION;  Surgeon: Leida LauthAndrew Berchuck, MD;  Location: [facility] ORS;  Service: Gynecology;  Laterality: N/A;    OB History    No data available      Home Medications    Prior to Admission medications   Medication Sig Start Date End Date Taking? Authorizing Provider  ibuprofen (ADVIL,MOTRIN) 600 MG tablet Take 1 tablet (600 mg total) by mouth every 6 (six) hours as needed for mild pain or cramping. 08/28/15   Conard NovakJackson, Johniya Durfee D, MD    Family History Family History  Problem Relation Age of Onset  . Cancer Mother     Social History Social History  Substance Use Topics  . Smoking status: Never Smoker  . Smokeless tobacco: Never Used  . Alcohol use No   Allergies   Patient has no known allergies.   Review of Systems Review of Systems  All systems reviewed and are negative for acute change except as noted in the HPI.  Physical Exam Updated Vital Signs BP (!) 111/58 (BP Location: Left Arm)   Pulse 61   Temp 97.3 F (36.3 C) (Oral)   Resp 16   LMP 08/15/2015 (Within Days)   SpO2 97%   Physical Exam  Constitutional: She is oriented to person, place, and time. She appears well-developed and well-nourished. No distress.  HENT:  Head: Normocephalic and atraumatic.  Mouth/Throat: Oropharynx is clear and moist. No oropharyngeal exudate.  Eyes:  Conjunctivae and EOM are normal. Pupils are equal, round, and reactive to light.  Neck: Normal range of motion. Neck supple.  No meningismus.  Cardiovascular: Normal rate, regular rhythm, normal heart sounds and intact distal pulses.   No murmur heard. Pulmonary/Chest: Effort normal and breath sounds normal. No respiratory distress. She exhibits tenderness (left chest  wall).  Abdominal: Soft. There is no tenderness. There is no rebound and no guarding.  Musculoskeletal: Normal range of motion. She exhibits no edema or tenderness.  Neurological: She is alert and oriented to person, place, and time. No cranial nerve deficit. She exhibits normal muscle tone. Coordination normal.  5/5 strength throughout. CN 2-12 intact. Subtle left arm weakness. No pronator drift. No ataxia on finger to nose. Subjective decreased sensation to left face, arm and leg.   Skin: Skin is warm.  Psychiatric: She has a normal mood and affect. Her behavior is normal.  Nursing note and vitals reviewed.   ED Treatments / Results  DIAGNOSTIC STUDIES:  Oxygen Saturation is 97% on RA, normal by my interpretation.    COORDINATION OF CARE:  1:20 AM Discussed treatment plan with pt at bedside and pt agreed to plan.   Labs (all labs ordered are listed, but only abnormal results are displayed) Labs Reviewed  COMPREHENSIVE METABOLIC PANEL - Abnormal; Notable for the following:       Result Value   AST 80 (*)    ALT 104 (*)    All other components within normal limits  URINALYSIS, ROUTINE W REFLEX MICROSCOPIC - Abnormal; Notable for the following:    Color, Urine STRAW (*)    Hgb urine dipstick MODERATE (*)    Squamous Epithelial / LPF 0-5 (*)    All other components within normal limits  I-STAT CHEM 8, ED - Abnormal; Notable for the following:    Calcium, Ion 1.08 (*)    All other components within normal limits  ETHANOL  PROTIME-INR  APTT  CBC  DIFFERENTIAL  RAPID URINE DRUG SCREEN, HOSP PERFORMED  I-STAT TROPOININ, ED  I-STAT BETA HCG BLOOD, ED ([facility], [facility], [facility] ONLY)    EKG  EKG Interpretation None       Radiology Mr Brain Wo Contrast  Result Date: 05/26/2016 CLINICAL DATA:  47 y/o F; gradual onset, gradually worsening, intermittent headache with left-sided numbness and weakness. EXAM: MRI HEAD WITHOUT CONTRAST MRI CERVICAL SPINE WITHOUT CONTRAST TECHNIQUE:  Multiplanar, multiecho pulse sequences of the brain and surrounding structures, and cervical spine, to include the craniocervical junction and cervicothoracic junction, were obtained without intravenous contrast. COMPARISON:  None. FINDINGS: MRI HEAD FINDINGS Brain: No acute infarction, hemorrhage, hydrocephalus, extra-axial collection or mass lesion. 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 Vascular: Normal flow voids. Skull and upper cervical spine: Normal marrow signal. Sinuses/Orbits: Negative. Other: None. MRI CERVICAL SPINE FINDINGS Alignment: Straightening of cervical lordosis.  No listhesis. Vertebrae: No fracture, evidence of discitis, or bone lesion. Cord: Normal signal and morphology. Posterior Fossa, vertebral arteries, paraspinal tissues: Negative. Disc levels: Mild disc desiccation and small central protrusions at the C3-4, C4-5, and C5-6 levels. Slight ventral thecal sac effacement those levels. No significant foraminal narrowing or canal stenosis. No cord impingement. IMPRESSION: 1. Normal MRI of the brain. 2. No abnormal cervical cord signal. 3. Minimal discogenic degenerative changes of the cervical spine from the C3-4 to C5-6 levels. 4. No significant foraminal narrowing or canal stenosis. No cord impingement. Electronically Signed   By: Mitzi Hansen M.D.   On: 05/26/2016 03:38  Mr Cervical Spine Wo Contrast  Result Date: 05/26/2016 CLINICAL DATA:  46 y/o F; gradual onset, gradually worsening, intermittent headache with left-sided numbness and weakness. EXAM: MRI HEAD WITHOUT CONTRAST MRI CERVICAL SPINE WITHOUT CONTRAST TECHNIQUE: Multiplanar, multiecho pulse sequences of the brain and surrounding structures, and cervical spine, to include the craniocervical junction and cervicothoracic junction, were obtained without intravenous contrast. COMPARISON:  None. FINDINGS: MRI HEAD FINDINGS Brain: No acute infarction, hemorrhage, hydrocephalus, extra-axial  collection or mass lesion. 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 Vascular: Normal flow voids. Skull and upper cervical spine: Normal marrow signal. Sinuses/Orbits: Negative. Other: None. MRI CERVICAL SPINE FINDINGS Alignment: Straightening of cervical lordosis.  No listhesis. Vertebrae: No fracture, evidence of discitis, or bone lesion. Cord: Normal signal and morphology. Posterior Fossa, vertebral arteries, paraspinal tissues: Negative. Disc levels: Mild disc desiccation and small central protrusions at the C3-4, C4-5, and C5-6 levels. Slight ventral thecal sac effacement those levels. No significant foraminal narrowing or canal stenosis. No cord impingement. IMPRESSION: 1. Normal MRI of the brain. 2. No abnormal cervical cord signal. 3. Minimal discogenic degenerative changes of the cervical spine from the C3-4 to C5-6 levels. 4. No significant foraminal narrowing or canal stenosis. No cord impingement. Electronically Signed   By: Mitzi Hansen M.D.   On: 05/26/2016 03:38    Procedures Procedures (including critical care time)  Medications Ordered in ED Medications - No data to display   Initial Impression / Assessment and Plan / ED Course  I have reviewed the triage vital signs and the nursing notes.  Pertinent labs & imaging results that were available during my care of the patient were reviewed by me and considered in my medical decision making (see chart for details).     L sided numbness since yesterday afternoon. Subtle L arm weakness on exam. Code stroke not activated due to delay in presentation.  Labs reassuring. Treat as complicated migraine with cocktail.   MRI obtained and no evidence of stroke or MS.  No C spine lesion.  D/w Dr. Amada Jupiter of neurology who agrees no further emergent workup needed.  Headache and symptoms improved with treatment.  Followup with neurology. Return precautions discussed.  Final Clinical Impressions(s) /  ED Diagnoses   Final diagnoses:  Complicated migraine    New Prescriptions New Prescriptions   No medications on file   I personally performed the services described in this documentation, which was scribed in my presence. The recorded information has been reviewed and is accurate.    Glynn Octave, MD 05/26/16 2030

## 2016-05-26 NOTE — ED Notes (Signed)
Pt verbalized understanding of d/c instructions and has no further questions. Pt is stable, A&Ox4, VSS.  

## 2016-05-26 NOTE — ED Notes (Signed)
MD notified of BP

## 2016-05-26 NOTE — Discharge Instructions (Signed)
There is no evidence of stroke. Follow up with the neurologist. Return to the ED if you develop new or worsening symptoms. °

## 2018-03-02 ENCOUNTER — Other Ambulatory Visit: Payer: Self-pay

## 2018-03-02 ENCOUNTER — Emergency Department (HOSPITAL_COMMUNITY)
Admission: EM | Admit: 2018-03-02 | Discharge: 2018-03-02 | Disposition: A | Discharge status: Home / Self Care | Payer: Self-pay | Attending: Emergency Medicine | Admitting: Emergency Medicine

## 2018-03-02 ENCOUNTER — Emergency Department (HOSPITAL_COMMUNITY): Payer: Self-pay

## 2018-03-02 ENCOUNTER — Encounter (HOSPITAL_COMMUNITY): Payer: Self-pay | Admitting: *Deleted

## 2018-03-02 DIAGNOSIS — G43109 Migraine with aura, not intractable, without status migrainosus: Secondary | ICD-10-CM | POA: Insufficient documentation

## 2018-03-02 LAB — CBC
HCT: 44.5 % (ref 36.0–46.0)
Hemoglobin: 15 g/dL (ref 12.0–15.0)
MCH: 30.1 pg (ref 26.0–34.0)
MCHC: 33.7 g/dL (ref 30.0–36.0)
MCV: 89.2 fL (ref 80.0–100.0)
Platelets: 288 10*3/uL (ref 150–400)
RBC: 4.99 MIL/uL (ref 3.87–5.11)
RDW: 12.4 % (ref 11.5–15.5)
WBC: 7.7 10*3/uL (ref 4.0–10.5)
nRBC: 0 % (ref 0.0–0.2)

## 2018-03-02 LAB — I-STAT TROPONIN, ED
TROPONIN I, POC: 0 ng/mL (ref 0.00–0.08)
Troponin i, poc: 0.01 ng/mL (ref 0.00–0.08)

## 2018-03-02 LAB — URINALYSIS, ROUTINE W REFLEX MICROSCOPIC
BACTERIA UA: NONE SEEN
Bilirubin Urine: NEGATIVE
Glucose, UA: NEGATIVE mg/dL
Ketones, ur: NEGATIVE mg/dL
Leukocytes,Ua: NEGATIVE
Nitrite: NEGATIVE
Protein, ur: NEGATIVE mg/dL
Specific Gravity, Urine: 1.03 (ref 1.005–1.030)
pH: 5 (ref 5.0–8.0)

## 2018-03-02 LAB — BASIC METABOLIC PANEL
Anion gap: 12 (ref 5–15)
BUN: 12 mg/dL (ref 6–20)
CALCIUM: 10 mg/dL (ref 8.9–10.3)
CO2: 28 mmol/L (ref 22–32)
CREATININE: 0.85 mg/dL (ref 0.44–1.00)
Chloride: 99 mmol/L (ref 98–111)
GFR calc Af Amer: >60 mL/min (ref >60)
GLUCOSE: 125 mg/dL — AB (ref 70–99)
Potassium: 3.6 mmol/L (ref 3.5–5.1)
Sodium: 139 mmol/L (ref 135–145)

## 2018-03-02 LAB — I-STAT BETA HCG BLOOD, ED (MC, WL, AP ONLY): I-stat hCG, quantitative: <5 m[IU]/mL (ref <5)

## 2018-03-02 LAB — LIPASE, BLOOD: Lipase: 36 U/L (ref 11–51)

## 2018-03-02 MED ORDER — KETOROLAC TROMETHAMINE 15 MG/ML IJ SOLN
15.0000 mg | Freq: Once | INTRAMUSCULAR | Status: AC
  Administered 2018-03-02: 15 mg via INTRAVENOUS
  Filled 2018-03-02: qty 1

## 2018-03-02 MED ORDER — ONDANSETRON 4 MG PO TBDP
4.0000 mg | ORAL_TABLET | Freq: Once | ORAL | Status: AC | PRN
  Administered 2018-03-02: 4 mg via ORAL
  Filled 2018-03-02: qty 1

## 2018-03-02 MED ORDER — PROCHLORPERAZINE EDISYLATE 10 MG/2ML IJ SOLN
10.0000 mg | Freq: Once | INTRAMUSCULAR | Status: AC
  Administered 2018-03-02: 10 mg via INTRAVENOUS
  Filled 2018-03-02: qty 2

## 2018-03-02 MED ORDER — SODIUM CHLORIDE 0.9 % IV BOLUS
1000.0000 mL | Freq: Once | INTRAVENOUS | Status: AC
  Administered 2018-03-02: 1000 mL via INTRAVENOUS

## 2018-03-02 MED ORDER — DIPHENHYDRAMINE HCL 50 MG/ML IJ SOLN
25.0000 mg | Freq: Once | INTRAMUSCULAR | Status: AC
  Administered 2018-03-02: 25 mg via INTRAVENOUS
  Filled 2018-03-02: qty 1

## 2018-03-02 MED ORDER — SODIUM CHLORIDE 0.9% FLUSH
3.0000 mL | Freq: Once | INTRAVENOUS | Status: AC
  Administered 2018-03-02: 3 mL via INTRAVENOUS

## 2018-03-02 NOTE — Discharge Instructions (Signed)
You were evaluated in the Emergency Department and after careful evaluation, we did not find any emergent condition requiring admission or further testing in the hospital.  Your symptoms today seem to be due to a complex migraine.  Your symptoms resolved after migraine medication.  Follow up with neurology to discuss preventative medications.  Please return to the Emergency Department if you experience any worsening of your condition.  We encourage you to follow up with a primary care provider.  Thank you for allowing [place] to be a part of your care.

## 2018-03-02 NOTE — ED Notes (Signed)
Patient verbalizes understanding of discharge instructions. Opportunity for questioning and answers were provided. Armband removed by staff, pt discharged from ED. Follow up care reviewed. 

## 2018-03-02 NOTE — ED Provider Notes (Signed)
[facility] Emergency Department Provider Note MRN:  [id number]  Arrival date & time: 03/02/18     Chief Complaint   Shortness of Breath and Dizziness   History of Present Illness   Julia Espinoza is a 48 y.o. year-old female with a history of complex migraine presenting to the ED with chief complaint of shortness of breath and dizziness.  Patient explains that her symptoms began at 10 AM with chest pain, located in the left side of the chest, sharp, nonradiating, constant, moderate in severity.  Also endorsing dull frontal headache that began gradually.  Began experiencing paresthesias and/or decreased sensation to the left arm and left leg.  Had a very similar presentation to the emergency department 2 years ago.  Denies fever, no abdominal pain, no dysuria.  Review of Systems  A complete 10 system review of systems was obtained and all systems are negative except as noted in the HPI and PMH.   Patient's Health History    Past Medical History:  Diagnosis Date  . Complication of anesthesia     with c-section/spinal anesthesia  . Thyroid disease     Past Surgical History:  Procedure Laterality Date  . CESAREAN SECTION    . CHOLECYSTECTOMY N/A 06/17/2012   Procedure: LAPAROSCOPIC CHOLECYSTECTOMY WITH INTRAOPERATIVE CHOLANGIOGRAM;  Surgeon: Axel FillerArmando Ramirez, MD;  Location: [facility] OR;  Service: General;  Laterality: N/A;  . CYSTOSCOPY  08/28/2015   Procedure: CYSTOSCOPY;  Surgeon: Leida LauthAndrew Berchuck, MD;  Location: [facility] ORS;  Service: Gynecology;;  . LAPAROSCOPIC HYSTERECTOMY N/A 08/28/2015   Procedure: HYSTERECTOMY TOTAL LAPAROSCOPIC WITH BILATERAL SALPINGECTOMY;  Surgeon: Leida LauthAndrew Berchuck, MD;  Location: [facility] ORS;  Service: Gynecology;  Laterality: N/A;  . PELVIC LYMPH NODE DISSECTION N/A 08/28/2015   Procedure: PELVIC/AORTIC LYMPH NODE RESECTION;  Surgeon: Leida LauthAndrew Berchuck, MD;  Location: [facility] ORS;  Service: Gynecology;  Laterality: N/A;    Family History  Problem  Relation Age of Onset  . Cancer Mother     Social History   Socioeconomic History  . Marital status: Married    Spouse name: Not on file  . Number of children: Not on file  . Years of education: Not on file  . Highest education level: Not on file  Occupational History  . Not on file  Social Needs  . Financial resource strain: Not on file  . Food insecurity:    Worry: Not on file    Inability: Not on file  . Transportation needs:    Medical: Not on file    Non-medical: Not on file  Tobacco Use  . Smoking status: Never Smoker  . Smokeless tobacco: Never Used  Substance and Sexual Activity  . Alcohol use: No  . Drug use: No  . Sexual activity: Not on file  Lifestyle  . Physical activity:    Days per week: Not on file    Minutes per session: Not on file  . Stress: Not on file  Relationships  . Social connections:    Talks on phone: Not on file    Gets together: Not on file    Attends religious service: Not on file    Active member of club or organization: Not on file    Attends meetings of clubs or organizations: Not on file    Relationship status: Not on file  . Intimate partner violence:    Fear of current or ex partner: Not on file    Emotionally abused: Not on file    Physically abused: Not on  file    Forced sexual activity: Not on file  Other Topics Concern  . Not on file  Social History Narrative  . Not on file     Physical Exam  Vital Signs and Nursing Notes reviewed Vitals:   03/02/18 1958 03/02/18 2200  BP: 100/63 105/65  Pulse: 66 63  Resp: 14 10  Temp: 98.5 F (36.9 C)   SpO2: 98% 100%    CONSTITUTIONAL: Well-appearing, NAD NEURO:  Alert and oriented x 3, normal and symmetric strength, subjective decreased sensation to left arm, left leg, no visual deficits, no aphasia, no neglect EYES:  eyes equal and reactive ENT/NECK:  no LAD, no JVD CARDIO: Regular rate, well-perfused, normal S1 and S2 PULM:  CTAB no wheezing or rhonchi GI/GU:  normal  bowel sounds, non-distended, non-tender MSK/SPINE:  No gross deformities, no edema SKIN:  no rash, atraumatic PSYCH:  Appropriate speech and behavior  Diagnostic and Interventional Summary    EKG Interpretation  Date/Time:  Wednesday March 02 2018 16:23:30 EST Ventricular Rate:  73 PR Interval:  132 QRS Duration: 82 QT Interval:  392 QTC Calculation: 431 R Axis:   77 Text Interpretation:  Normal sinus rhythm Normal ECG Confirmed by Kennis Carina [phone]) on 03/02/2018 9:06:34 PM      Labs Reviewed  BASIC METABOLIC PANEL - Abnormal; Notable for the following components:      Result Value   Glucose, Bld 125 (*)    All other components within normal limits  URINALYSIS, ROUTINE W REFLEX MICROSCOPIC - Abnormal; Notable for the following components:   APPearance HAZY (*)    Hgb urine dipstick SMALL (*)    All other components within normal limits  CBC  LIPASE, BLOOD  I-STAT BETA HCG BLOOD, ED ([facility], [facility], [facility] ONLY)  I-STAT TROPONIN, ED  I-STAT TROPONIN, ED    DG Chest 2 View  Final Result      Medications  sodium chloride 0.9 % bolus 1,000 mL (1,000 mLs Intravenous New Bag/Given 03/02/18 2203)  sodium chloride flush (NS) 0.9 % injection 3 mL (3 mLs Intravenous Given 03/02/18 2203)  ondansetron (ZOFRAN-ODT) disintegrating tablet 4 mg (4 mg Oral Given 03/02/18 1634)  ketorolac (TORADOL) 15 MG/ML injection 15 mg (15 mg Intravenous Given 03/02/18 2204)  diphenhydrAMINE (BENADRYL) injection 25 mg (25 mg Intravenous Given 03/02/18 2204)  prochlorperazine (COMPAZINE) injection 10 mg (10 mg Intravenous Given 03/02/18 2204)     Procedures Critical Care  ED Course and Medical Decision Making  I have reviewed the triage vital signs and the nursing notes.  Pertinent labs & imaging results that were available during my care of the patient were reviewed by me and considered in my medical decision making (see below for details).  Favoring recurrence of complex migraine in this 48 year old  female with history of the nearly identical presentation.  Patient is with normal vital signs, in no acute distress, conditions such as acute stroke or dissection thought to be much less likely at this time.  Will attempt migraine cocktail, reassess.  Clinical Course as of Mar 02 2246  Wed Mar 02, 2018  2225 Troponin negative x2.  Upon reassessment after migraine cocktail, patient's symptoms are completely resolved, no more headache, no more sensory changes.  Completely normal neurological exam, no meningismus, no fever here.  Appropriate for discharge, will refer to neurology given this being her second complex migraine.   [MB]    Clinical Course User Index [MB] Sabas Sous, MD     After the  discussed management above, the patient was determined to be safe for discharge.  The patient was in agreement with this plan and all questions regarding their care were answered.  ED return precautions were discussed and the patient will return to the ED with any significant worsening of condition.  Elmer SowMichael M. Pilar PlateBero, MD [facility] [email or web address]  Final Clinical Impressions(s) / ED Diagnoses     ICD-10-CM   1. Complicated migraine G43.109     ED Discharge Orders    None         Sabas SousBero, Kurtiss Wence M, MD 03/02/18 2248

## 2018-03-02 NOTE — ED Triage Notes (Addendum)
Pt's daughter reports pt started to have dizziness and sob today.  She also had cp which resolved.  Reports nausea and mid abd pain.  No vomiting.  Pt also reports pain in her L leg and her head.  The pain in her L leg is precipitated by the pain in her chest.

## 2019-05-18 ENCOUNTER — Other Ambulatory Visit: Payer: Self-pay

## 2019-05-18 ENCOUNTER — Emergency Department (HOSPITAL_COMMUNITY)
Admission: EM | Admit: 2019-05-18 | Discharge: 2019-05-18 | Disposition: A | Discharge status: Home / Self Care | Payer: Self-pay | Attending: Emergency Medicine | Admitting: Emergency Medicine

## 2019-05-18 ENCOUNTER — Encounter (HOSPITAL_COMMUNITY): Payer: Self-pay | Admitting: Pediatrics

## 2019-05-18 ENCOUNTER — Emergency Department (HOSPITAL_COMMUNITY): Payer: Self-pay

## 2019-05-18 DIAGNOSIS — E079 Disorder of thyroid, unspecified: Secondary | ICD-10-CM | POA: Insufficient documentation

## 2019-05-18 DIAGNOSIS — R519 Headache, unspecified: Secondary | ICD-10-CM | POA: Insufficient documentation

## 2019-05-18 DIAGNOSIS — M7918 Myalgia, other site: Secondary | ICD-10-CM | POA: Insufficient documentation

## 2019-05-18 LAB — CBC
HCT: 44.7 % (ref 36.0–46.0)
Hemoglobin: 14.7 g/dL (ref 12.0–15.0)
MCH: 30.2 pg (ref 26.0–34.0)
MCHC: 32.9 g/dL (ref 30.0–36.0)
MCV: 92 fL (ref 80.0–100.0)
Platelets: 275 10*3/uL (ref 150–400)
RBC: 4.86 MIL/uL (ref 3.87–5.11)
RDW: 12.2 % (ref 11.5–15.5)
WBC: 7.7 10*3/uL (ref 4.0–10.5)
nRBC: 0 % (ref 0.0–0.2)

## 2019-05-18 LAB — BASIC METABOLIC PANEL
Anion gap: 8 (ref 5–15)
BUN: 12 mg/dL (ref 6–20)
CO2: 27 mmol/L (ref 22–32)
Calcium: 9.6 mg/dL (ref 8.9–10.3)
Chloride: 105 mmol/L (ref 98–111)
Creatinine, Ser: 0.84 mg/dL (ref 0.44–1.00)
GFR calc Af Amer: >60 mL/min (ref >60)
GFR calc non Af Amer: >60 mL/min (ref >60)
Glucose, Bld: 95 mg/dL (ref 70–99)
Potassium: 3.6 mmol/L (ref 3.5–5.1)
Sodium: 140 mmol/L (ref 135–145)

## 2019-05-18 LAB — HEPATIC FUNCTION PANEL
ALT: 28 U/L (ref 0–44)
AST: 26 U/L (ref 15–41)
Albumin: 4.2 g/dL (ref 3.5–5.0)
Alkaline Phosphatase: 58 U/L (ref 38–126)
Bilirubin, Direct: <0.1 mg/dL (ref 0.0–0.2)
Total Bilirubin: 0.5 mg/dL (ref 0.3–1.2)
Total Protein: 8.1 g/dL (ref 6.5–8.1)

## 2019-05-18 LAB — I-STAT BETA HCG BLOOD, ED (MC, WL, AP ONLY): I-stat hCG, quantitative: <5 m[IU]/mL (ref <5)

## 2019-05-18 LAB — TROPONIN I (HIGH SENSITIVITY)
Troponin I (High Sensitivity): 12 ng/L (ref <18)
Troponin I (High Sensitivity): 2 ng/L (ref <18)

## 2019-05-18 MED ORDER — DIPHENHYDRAMINE HCL 50 MG/ML IJ SOLN
25.0000 mg | Freq: Once | INTRAMUSCULAR | Status: AC
  Administered 2019-05-18: 19:00:00 25 mg via INTRAVENOUS
  Filled 2019-05-18: qty 1

## 2019-05-18 MED ORDER — NAPROXEN 500 MG PO TABS: 500.0000 mg | ORAL_TABLET | Freq: Two times a day (BID) | ORAL | 0 refills | Status: AC

## 2019-05-18 MED ORDER — SODIUM CHLORIDE 0.9% FLUSH: 3.0000 mL | Freq: Once | INTRAVENOUS | Status: DC

## 2019-05-18 MED ORDER — PROCHLORPERAZINE EDISYLATE 10 MG/2ML IJ SOLN
10.0000 mg | Freq: Once | INTRAMUSCULAR | Status: AC
  Administered 2019-05-18: 10 mg via INTRAVENOUS
  Filled 2019-05-18: qty 2

## 2019-05-18 NOTE — Discharge Instructions (Signed)
Los [employer] de su sangre estuvieron normales durante su visita.  Le he recetado un antiinflamatorio para los dolores. El numero de [facility] esta en sus papeles, llame para hacer una cita y obtener un doctor de cabezera.

## 2019-05-18 NOTE — ED Triage Notes (Signed)
C/O left sided pain on head and body at 10 am today. Pt endorsed  tingling sensation that started yesterday around 5 pm. Denies medical hx.

## 2019-05-18 NOTE — ED Provider Notes (Signed)
[facility] EMERGENCY DEPARTMENT Provider Note   CSN: [id number] Arrival date & time: 05/18/19  1242     History Chief Complaint  Patient presents with  . Headache    Julia Espinoza is a 49 y.o. female.  49 y.o female with no PMH presents to the ED with a chief complaint of left body pain x 10 am. Originates from the head to the toe, tingling to the left side of her body.  Patient reports that began yesterday around 10 AM, this started with a headache which she has had in the past however symptoms usually resolve on their own.  She reports a left-sided pressure with radiation from her head all the way to her left toe.  She has taken some ibuprofen for mild improvement in symptoms which relieved the headache.  She also reports pain along the left side of her back with radiation down to her leg, feels that this is "squeezing nature ".  She reports no changes in her vision, nausea, vomiting, shortness of breath, dizziness..    The history is provided by the patient.  Headache Pain location:  L temporal Quality:  Sharp Radiates to:  Does not radiate Severity currently:  5/10 Duration:  2 days Timing:  Constant Progression:  Unchanged Chronicity:  Recurrent Associated symptoms: back pain   Associated symptoms: no abdominal pain, no diarrhea, no dizziness, no fever, no nausea, no sore throat and no vomiting        Past Medical History:  Diagnosis Date  . Complication of anesthesia     with c-section/spinal anesthesia  . Thyroid disease     Patient Active Problem List   Diagnosis Date Noted  . Pain in joint, pelvic region and thigh 07/10/2015  . Dysmenorrhea 07/10/2015  . Abnormal uterine bleeding 07/10/2015  . Symptomatic cholelithiasis 06/17/2012  . Transaminitis 06/17/2012    Past Surgical History:  Procedure Laterality Date  . CESAREAN SECTION    . CHOLECYSTECTOMY N/A 06/17/2012   Procedure: LAPAROSCOPIC CHOLECYSTECTOMY WITH INTRAOPERATIVE  CHOLANGIOGRAM;  Surgeon: Axel Filler, MD;  Location: [facility] OR;  Service: General;  Laterality: N/A;  . CYSTOSCOPY  08/28/2015   Procedure: CYSTOSCOPY;  Surgeon: Leida Lauth, MD;  Location: [facility] ORS;  Service: Gynecology;;  . LAPAROSCOPIC HYSTERECTOMY N/A 08/28/2015   Procedure: HYSTERECTOMY TOTAL LAPAROSCOPIC WITH BILATERAL SALPINGECTOMY;  Surgeon: Leida Lauth, MD;  Location: [facility] ORS;  Service: Gynecology;  Laterality: N/A;  . PELVIC LYMPH NODE DISSECTION N/A 08/28/2015   Procedure: PELVIC/AORTIC LYMPH NODE RESECTION;  Surgeon: Leida Lauth, MD;  Location: [facility] ORS;  Service: Gynecology;  Laterality: N/A;     OB History   No obstetric history on file.     Family History  Problem Relation Age of Onset  . Cancer Mother     Social History   Tobacco Use  . Smoking status: Never Smoker  . Smokeless tobacco: Never Used  Substance Use Topics  . Alcohol use: No  . Drug use: No    Home Medications Prior to Admission medications   Medication Sig Start Date End Date Taking? Authorizing Provider  ibuprofen (ADVIL,MOTRIN) 600 MG tablet Take 1 tablet (600 mg total) by mouth every 6 (six) hours as needed for mild pain or cramping. Patient not taking: Reported on 05/26/2016 08/28/15   Conard Novak, MD  naproxen (NAPROSYN) 500 MG tablet Take 1 tablet (500 mg total) by mouth 2 (two) times daily for 7 days. 05/18/19 05/25/19  Claude Manges, PA-C    Allergies  Patient has no known allergies.  Review of Systems   Review of Systems  Constitutional: Negative for fever.  HENT: Negative for sore throat.   Respiratory: Negative for shortness of breath.   Cardiovascular: Negative for chest pain.  Gastrointestinal: Negative for abdominal pain, diarrhea, nausea and vomiting.  Genitourinary: Negative for flank pain.  Musculoskeletal: Positive for back pain.  Skin: Negative for pallor and wound.  Neurological: Positive for headaches. Negative for dizziness, syncope and light-headedness.   All other systems reviewed and are negative.   Physical Exam Updated Vital Signs BP (!) 95/59   Pulse 62   Temp 98.4 F (36.9 C) (Oral)   Resp 13   Ht 5' 0.24" (1.53 m)   Wt 68 kg   LMP 08/15/2015 (Within Days)   SpO2 96%   BMI 29.07 kg/m   Physical Exam Vitals and nursing note reviewed.  Constitutional:      Appearance: She is well-developed. She is not ill-appearing or toxic-appearing.  HENT:     Head: Normocephalic and atraumatic.  Cardiovascular:     Rate and Rhythm: Normal rate.  Pulmonary:     Effort: Pulmonary effort is normal.     Breath sounds: No wheezing or rales.  Abdominal:     Palpations: Abdomen is soft.     Tenderness: There is no abdominal tenderness.  Musculoskeletal:     Cervical back: Normal range of motion and neck supple.  Skin:    General: Skin is warm and dry.  Neurological:     Mental Status: She is alert and oriented to person, place, and time.     Comments: Alert, oriented, thought content appropriate. Speech fluent without evidence of aphasia. Able to follow 2 step commands without difficulty.  Cranial Nerves:  II:  Peripheral visual fields grossly normal, pupils, round, reactive to light III,IV, VI: ptosis not present, extra-ocular motions intact bilaterally  V,VII: smile symmetric, facial light touch sensation equal VIII: hearing grossly normal bilaterally  IX,X: midline uvula rise  XI: bilateral shoulder shrug equal and strong XII: midline tongue extension  Motor:  5/5 in upper and lower extremities bilaterally including strong and equal grip strength and dorsiflexion/plantar flexion Sensory: light touch normal in all extremities.  Cerebellar: normal finger-to-nose with bilateral upper extremities, pronator drift negative Gait: normal gait and balance      ED Results / Procedures / Treatments   Labs (all labs ordered are listed, but only abnormal results are displayed) Labs Reviewed  BASIC METABOLIC PANEL  CBC  HEPATIC  FUNCTION PANEL  I-STAT BETA HCG BLOOD, ED ([facility], [facility], [facility] ONLY)  TROPONIN I (HIGH SENSITIVITY)  TROPONIN I (HIGH SENSITIVITY)    EKG None  Radiology DG Chest 2 View  Result Date: 05/18/2019 CLINICAL DATA:  Left arm pain EXAM: CHEST - 2 VIEW COMPARISON:  03/02/2018 FINDINGS: The heart size and mediastinal contours are within normal limits. Both lungs are clear. The visualized skeletal structures are unremarkable. IMPRESSION: No active cardiopulmonary disease. Electronically Signed   By: Davina Poke D.O.   On: 05/18/2019 14:18    Procedures Procedures (including critical care time)  Medications Ordered in ED Medications  sodium chloride flush (NS) 0.9 % injection 3 mL (has no administration in time range)  diphenhydrAMINE (BENADRYL) injection 25 mg (25 mg Intravenous Given 05/18/19 1843)  prochlorperazine (COMPAZINE) injection 10 mg (10 mg Intravenous Given 05/18/19 1843)    ED Course  I have reviewed the triage vital signs and the nursing notes.  Pertinent labs & imaging  results that were available during my care of the patient were reviewed by me and considered in my medical decision making (see chart for details).    MDM Rules/Calculators/A&P    Patient with no PMH presents to the ED with a chief complaint of left sided body pain x yesterday.  Patient does have a prior visit to the ED for complex migraines with some left-sided pain.  She reports she has had symptoms like these in the past which are consistent with her bad headache, on today's visit pain not improved after ibuprofen.  She does not have any changes in vision, nausea, vomiting.  She also endorses some lower back pain which is alleviated with standing and walking around.  During evaluation she is non-ill-appearing, nontoxic, texting on cell phone without any discomfort.  Lungs are clear to auscultation, neuro exam is benign, vitals are within normal limits, no fever, hypoxia or tachycardia.  Full range of motion of  her neck, no meningeal signs, lower suspicion for meningitis.  Neuro exam is benign, extensive review of her chart, does show an MRI for a similar complaint 2 years ago, this did not show any acute process stent, no aneurysms noted, no abnormality.  Suspicion of recurrent complex migraine.  Interpretation of her labs showed a CBC with no leukocytosis, hemoglobin is within normal limits.  BMP without any electrolyte normality, creatinine level is within normal limits.  Hepatic function is unremarkable.  First troponin is 12.  Do not feel that this is ACS as patient has whole body pain on the left side consistent with her prior history of migraines.  Beta hCG is negative.  8:34 PM patient received a headache cocktail, resolution in symptoms at this time.  She is stable, vitals are within normal limits.  She reports no Covid exposures, I offered her testing at this time but patient declined.  We will have her follow-up with [place] [facility] for further management of her recurrent headaches.  Patient understands agrees to management, return precautions discussed at length.   Portions of this note were generated with Scientist, clinical (histocompatibility and immunogenetics). Dictation errors may occur despite best attempts at proofreading.  Final Clinical Impression(s) / ED Diagnoses Final diagnoses:  Bad headache    Rx / DC Orders ED Discharge Orders         Ordered    naproxen (NAPROSYN) 500 MG tablet  2 times daily     05/18/19 2029           Claude Manges, PA-C 05/18/19 2046    Charlynne Pander, MD 05/18/19 2157

## 2020-06-11 IMAGING — DX DG CHEST 2V
2 series · 2 of 2 positions shown · non-contrast
Comparison: 08/04/2009

CLINICAL DATA: Chest pain and dyspnea

EXAM:
CHEST - 2 VIEW

[w chest lat]
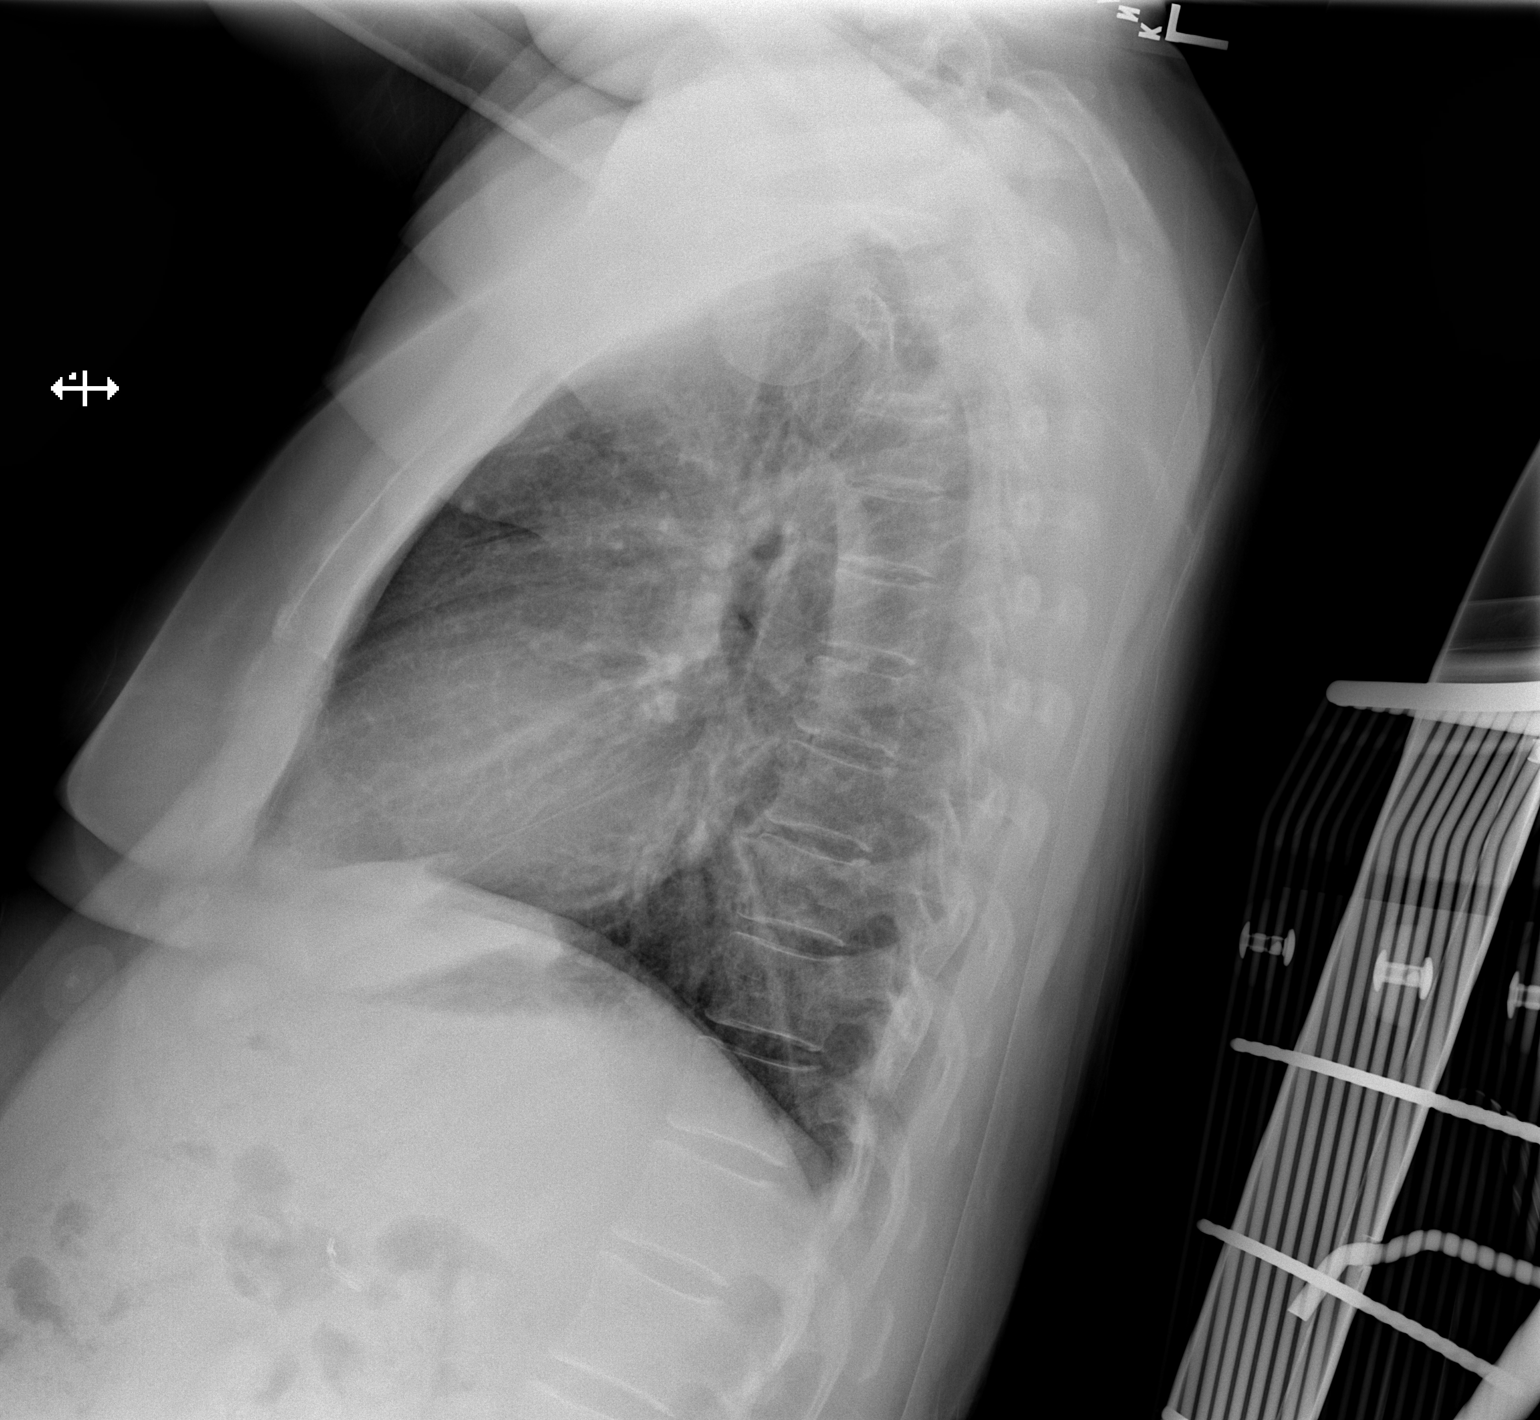

[x chest ap]
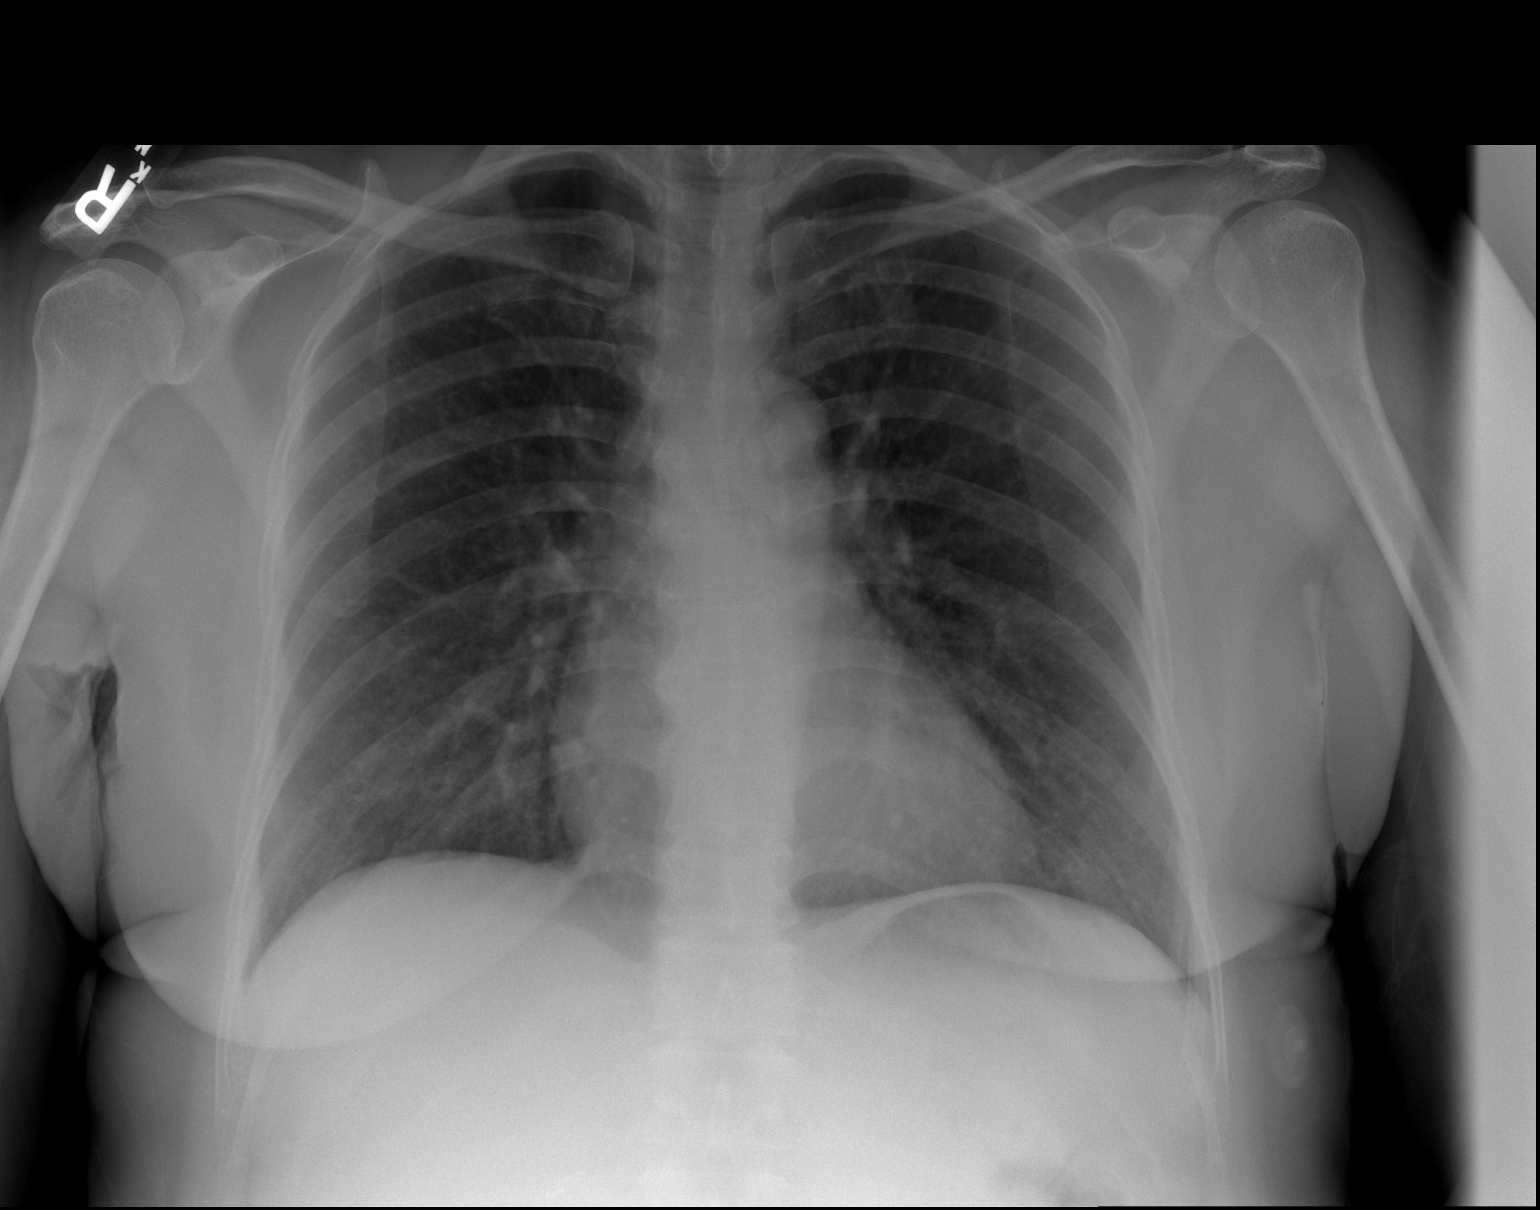

[2 of 2 positions shown; findings below may reference images not displayed]

FINDINGS: The heart size and mediastinal contours are within normal limits.
Both lungs are clear. The visualized skeletal structures are
unremarkable.
IMPRESSION: No active cardiopulmonary disease.

## 2021-08-27 IMAGING — DX DG CHEST 2V
2 series · 2 of 2 positions shown · non-contrast
Comparison: 03/02/2018

CLINICAL DATA: Left arm pain

EXAM:
CHEST - 2 VIEW

[chest pa]
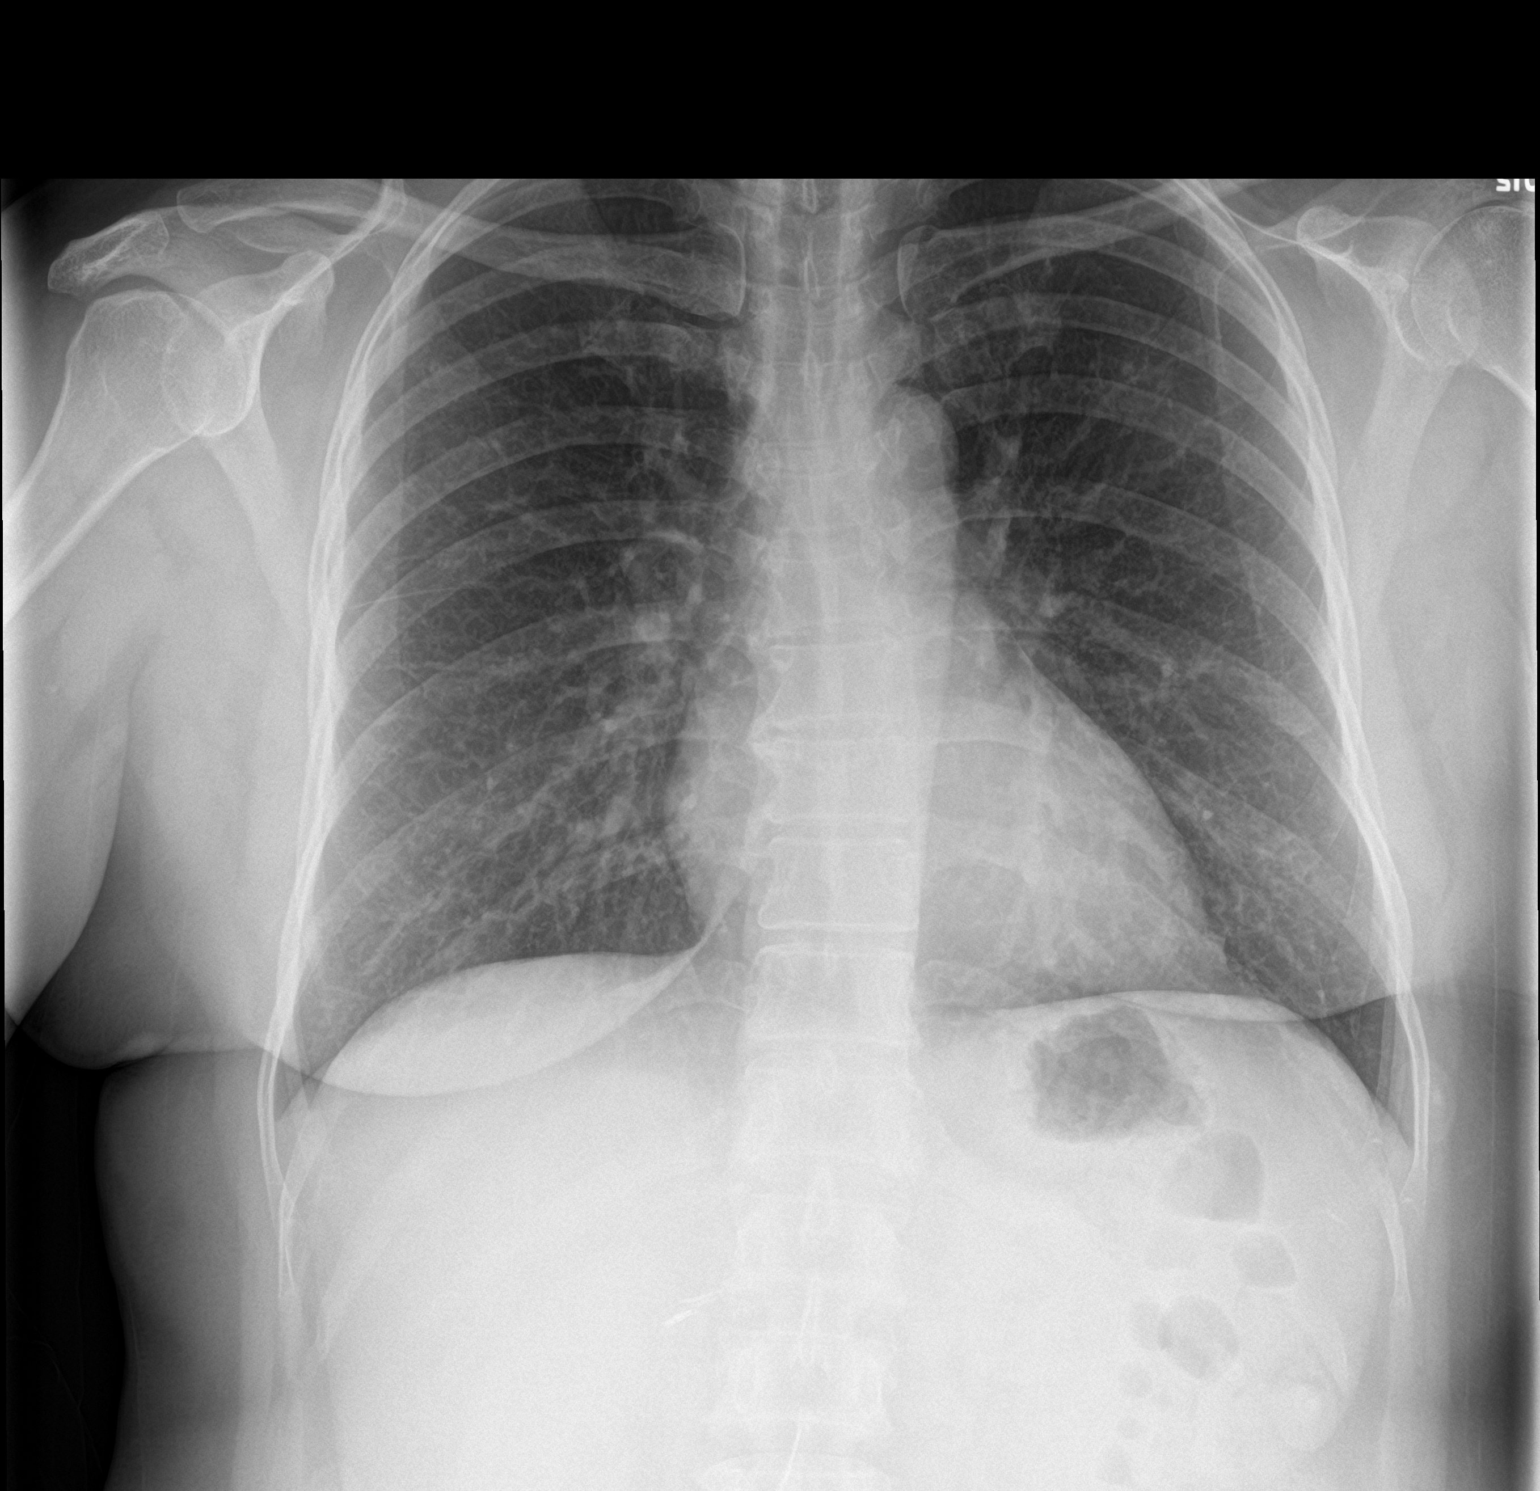

[chest lat]
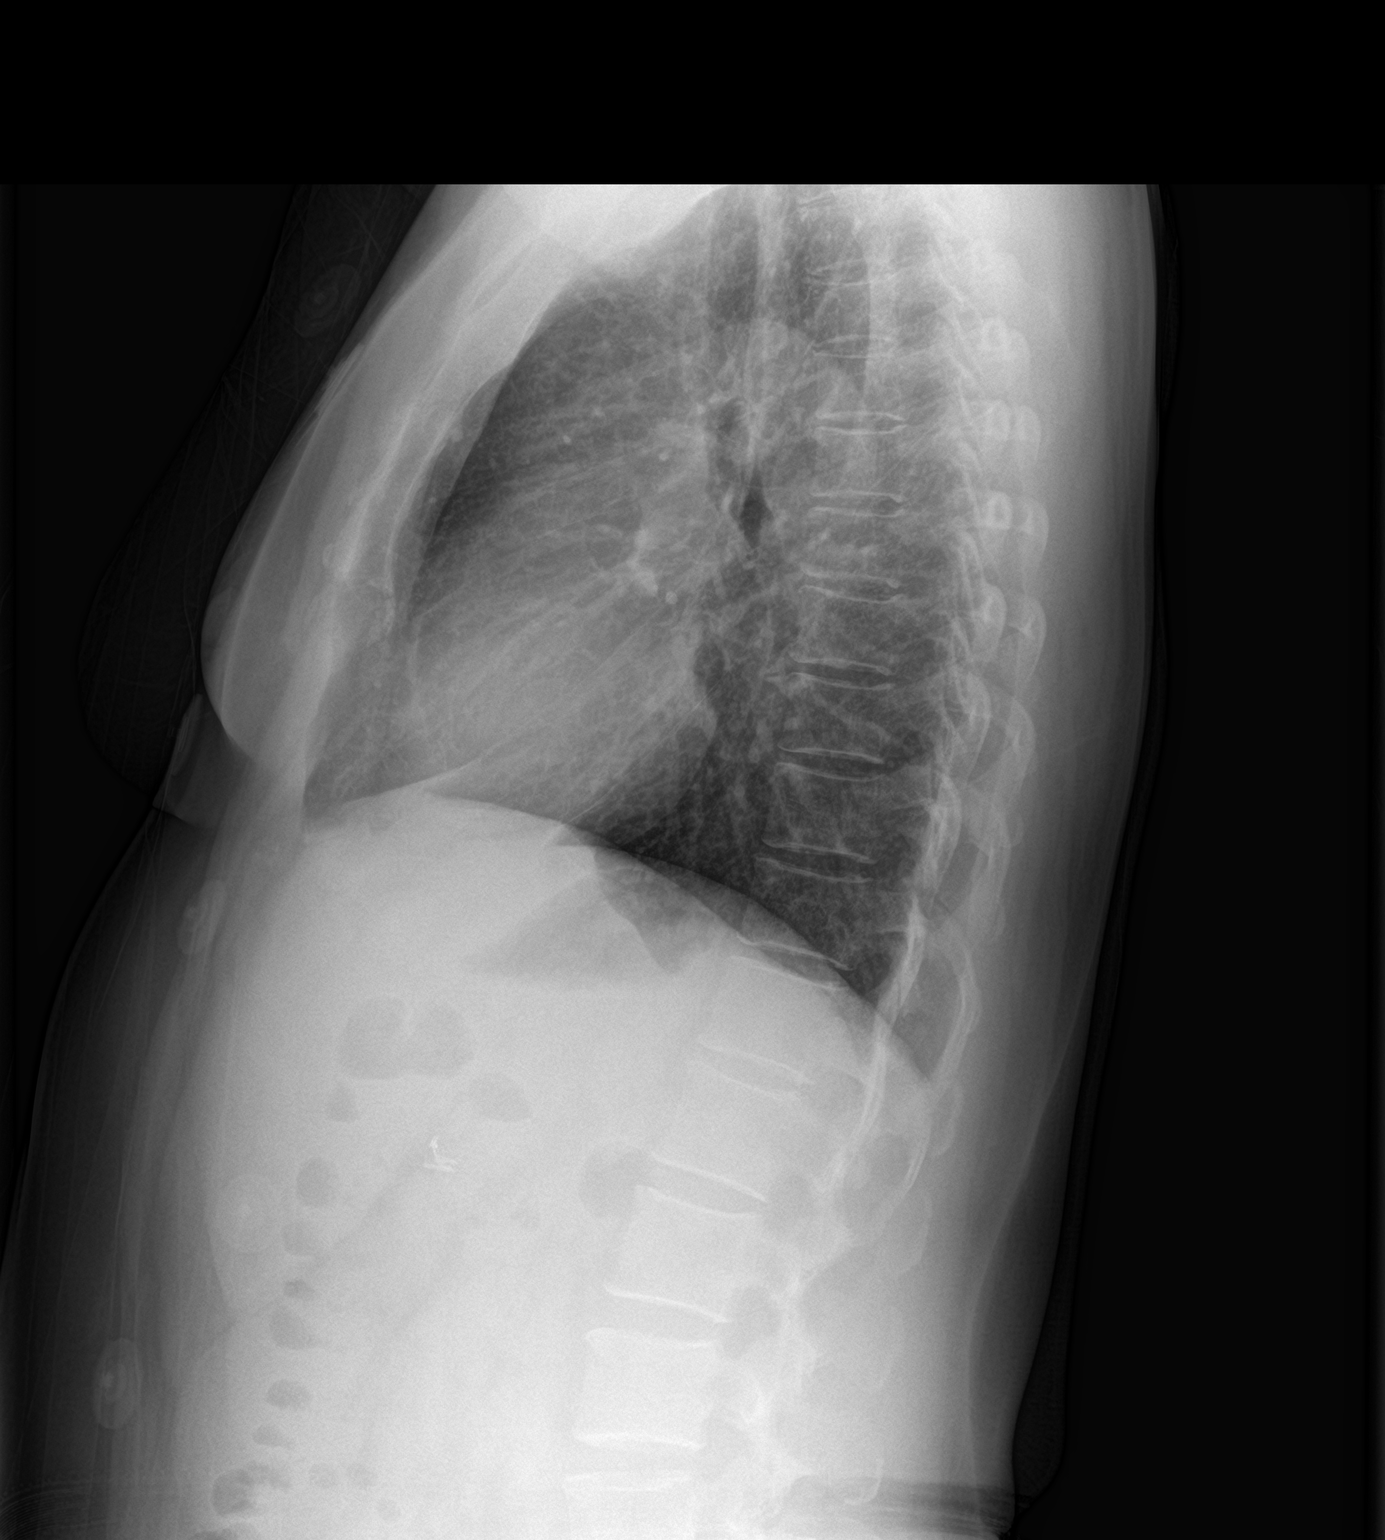

[2 of 2 positions shown; findings below may reference images not displayed]

FINDINGS: The heart size and mediastinal contours are within normal limits.
Both lungs are clear. The visualized skeletal structures are
unremarkable.
IMPRESSION: No active cardiopulmonary disease.

## 2022-07-01 ENCOUNTER — Encounter: Payer: Self-pay | Admitting: Family Medicine

## 2022-07-01 ENCOUNTER — Other Ambulatory Visit: Payer: Self-pay | Admitting: *Deleted

## 2022-07-01 ENCOUNTER — Inpatient Hospital Stay
Admission: RE | Admit: 2022-07-01 | Discharge: 2022-07-01 | Disposition: A | Discharge status: Home / Self Care | Payer: Self-pay | Source: Ambulatory Visit | Attending: Family Medicine | Admitting: Family Medicine

## 2022-07-01 DIAGNOSIS — Z1231 Encounter for screening mammogram for malignant neoplasm of breast: Secondary | ICD-10-CM

## 2022-07-08 ENCOUNTER — Other Ambulatory Visit: Payer: Self-pay

## 2022-07-08 DIAGNOSIS — Z1231 Encounter for screening mammogram for malignant neoplasm of breast: Secondary | ICD-10-CM

## 2022-09-14 ENCOUNTER — Ambulatory Visit: Payer: MEDICAID | Attending: Oncology | Admitting: Hematology and Oncology

## 2022-09-14 ENCOUNTER — Other Ambulatory Visit: Payer: Self-pay

## 2022-09-14 ENCOUNTER — Ambulatory Visit
Admission: RE | Admit: 2022-09-14 | Discharge: 2022-09-14 | Disposition: A | Discharge status: Home / Self Care | Payer: Self-pay | Source: Ambulatory Visit | Attending: Obstetrics and Gynecology | Admitting: Obstetrics and Gynecology

## 2022-09-14 VITALS — BP 112/60 | Wt 155.7 lb

## 2022-09-14 DIAGNOSIS — Z1231 Encounter for screening mammogram for malignant neoplasm of breast: Secondary | ICD-10-CM | POA: Insufficient documentation

## 2022-09-14 DIAGNOSIS — Z1211 Encounter for screening for malignant neoplasm of colon: Secondary | ICD-10-CM

## 2022-09-14 NOTE — Patient Instructions (Signed)
Taught Julia Espinoza about self breast awareness and gave educational materials to take home. Patient did not need a Pap smear today due to hysterectomy. Referred patient to the [facility] for diagnostic mammogram. Appointment scheduled for 09/14/22. Patient aware of appointment and will be there. Let patient know will follow up with her within the next couple weeks with results. Julia Espinoza verbalized understanding.  Pascal Lux, NP 12:59 PM

## 2022-09-14 NOTE — Progress Notes (Signed)
Ms. Bania Woolums is a 52 y.o. female who presents to [facility] clinic today with no complaints.    Pap Smear: Pap not smear completed today due to hysterectomy.   Physical exam: Breasts Breasts symmetrical. No skin abnormalities bilateral breasts. No nipple retraction bilateral breasts. No nipple discharge bilateral breasts. No lymphadenopathy. No lumps palpated bilateral breasts.       Pelvic/Bimanual Pap is not indicated today    Smoking History: Patient has never smoked and was not referred to quit line.    Patient Navigation: Patient education provided. Access to services provided for patient through [facility] program. Delos Haring interpreter provided. No transportation provided   Colorectal Cancer Screening: Per patient has never had colonoscopy completed No complaints today.    Breast and Cervical Cancer Risk Assessment: Patient does not have family history of breast cancer, known genetic mutations, or radiation treatment to the chest before age 25. Patient does not have history of cervical dysplasia, immunocompromised, or DES exposure in-utero.  Risk Assessment   No risk assessment data     A: B[facility]CP exam without pap smear No complaints with benign exam.   P: Referred patient to the [facility] for a screening mammogram. Appointment scheduled 09/14/22.  Pascal Lux, NP 09/14/2022 12:57 PM

## 2022-11-20 LAB — FECAL OCCULT BLOOD, IMMUNOCHEMICAL: Fecal Occult Bld: NEGATIVE

## 2023-11-03 ENCOUNTER — Telehealth: Payer: Self-pay
